# Patient Record
Sex: Female | Born: 1980 | Race: Black or African American | Hispanic: No | Marital: Married | Smoking: Never smoker
Health system: Southern US, Community
[De-identification: ages and names within clinical notes are randomized; demographics above are authoritative.]

## PROBLEM LIST (undated history)

## (undated) DIAGNOSIS — I10 Essential (primary) hypertension: Secondary | ICD-10-CM

## (undated) DIAGNOSIS — D649 Anemia, unspecified: Secondary | ICD-10-CM

## (undated) DIAGNOSIS — K219 Gastro-esophageal reflux disease without esophagitis: Secondary | ICD-10-CM

## (undated) DIAGNOSIS — R519 Headache, unspecified: Secondary | ICD-10-CM

## (undated) HISTORY — DX: Gastro-esophageal reflux disease without esophagitis: K21.9

## (undated) HISTORY — DX: Anemia, unspecified: D64.9

## (undated) HISTORY — DX: Headache, unspecified: R51.9

## (undated) HISTORY — DX: Essential (primary) hypertension: I10

---

## 2014-03-04 ENCOUNTER — Ambulatory Visit
Admission: RE | Admit: 2014-03-04 | Discharge: 2014-03-04 | Disposition: A | Discharge status: Home / Self Care | Payer: No Typology Code available for payment source | Source: Ambulatory Visit | Attending: Infectious Disease | Admitting: Infectious Disease

## 2014-03-04 ENCOUNTER — Other Ambulatory Visit: Payer: Self-pay | Admitting: Infectious Disease

## 2014-03-04 DIAGNOSIS — R7611 Nonspecific reaction to tuberculin skin test without active tuberculosis: Secondary | ICD-10-CM

## 2014-11-14 ENCOUNTER — Ambulatory Visit: Payer: Self-pay

## 2014-12-09 ENCOUNTER — Ambulatory Visit: Payer: Self-pay | Attending: Internal Medicine

## 2017-02-13 DIAGNOSIS — D649 Anemia, unspecified: Secondary | ICD-10-CM | POA: Insufficient documentation

## 2017-03-27 DIAGNOSIS — D509 Iron deficiency anemia, unspecified: Secondary | ICD-10-CM | POA: Insufficient documentation

## 2017-09-16 ENCOUNTER — Ambulatory Visit: Payer: Self-pay | Admitting: Nurse Practitioner

## 2017-09-16 VITALS — BP 110/85 | HR 64 | Temp 99.3°F | Resp 16 | Ht >= 80 in | Wt 305.8 lb

## 2017-09-16 DIAGNOSIS — Z0000 Encounter for general adult medical examination without abnormal findings: Secondary | ICD-10-CM

## 2017-09-16 NOTE — Progress Notes (Signed)
Subjective:  Linda [name] is a 37 y.o. female who presents for basic physical exam. Patient denies any current health related concerns. Patient denies taking any medications and does not have any medication allergies.  Patient's LMP was the first of January.  Denies smoking, drinks socially and does not use street drugs.  Patient needs "clinical statement of health" form completed for this visit.  Past medical history, allergies and medications reviewed with patient.  Social History   Tobacco Use  . Smoking status: Not on file  Substance Use Topics  . Alcohol use: Not on file  . Drug use: Not on file    Not on File  No current outpatient medications on file.   No current facility-administered medications for this visit.     Review of Systems  Constitutional: Negative.   HENT: Negative.   Eyes: Negative.   Respiratory: Negative.   Cardiovascular: Negative.   Gastrointestinal: Negative.   Genitourinary: Negative.   Musculoskeletal: Negative.   Skin: Negative.   Neurological: Negative.   Endo/Heme/Allergies: Negative.   Psychiatric/Behavioral: Negative.     BP 110/85 (BP Location: Right Arm, Patient Position: Sitting, Cuff Size: Normal)   Pulse 64   Temp 99.3 F (37.4 C) (Oral)   Resp 16   Ht 6\' 8"  (2.032 m)   Wt (!) 305 lb 12.8 oz (138.7 kg)   BMI 33.59 kg/m    Objective:  BP 110/85 (BP Location: Right Arm, Patient Position: Sitting, Cuff Size: Normal)   Pulse 64   Temp 99.3 F (37.4 C) (Oral)   Resp 16   Ht 6\' 8"  (2.032 m)   Wt (!) 305 lb 12.8 oz (138.7 kg)   BMI 33.59 kg/m   General Appearance:  Alert, cooperative, no distress, appears stated age  Head:  Normocephalic, without obvious abnormality, atraumatic  Eyes:  PERRL, conjunctiva/corneas clear, EOM's intact, fundi benign, both eyes  Ears:  Normal TM's and external ear canals, both ears  Nose: Nares normal, septum midline,mucosa normal, no drainage or sinus tenderness  Throat: Lips, mucosa,  and tongue normal; teeth and gums normal  Neck: Supple, symmetrical, trachea midline, no adenopathy;  thyroid: not enlarged, symmetric, no tenderness/mass/nodules; no carotid bruit or JVD  Back:   Symmetric, no curvature, ROM normal, no CVA tenderness  Lungs:   Clear to auscultation bilaterally, respirations unlabored  Breasts:  No masses or tenderness  Heart:  Regular rate and rhythm, S1 and S2 normal, no murmur, rub, or gallop  Abdomen:   Soft, non-tender, bowel sounds active all four quadrants,  no masses, no organomegaly  Pelvic: Deferred  Extremities: Extremities normal, atraumatic, no cyanosis or edema  Pulses: 2+ and symmetric  Skin: Skin color, texture, turgor normal, no rashes or lesions  Lymph nodes: Cervical and submandibular nodes normal  Neurologic: Normal        Assessment:  basic physical exam    Plan:  Patient education provided.  No labs needed at this time. Patient education provided regarding health maintenance and prevention.  Patient will follow up as needed. Patient will follow up with PCP.

## 2017-09-16 NOTE — Patient Instructions (Addendum)
Health Maintenance, Female Adopting a healthy lifestyle and getting preventive care can go a long way to promote health and wellness. Talk with your health care provider about what schedule of regular examinations is right for you. This is a good chance for you to check in with your provider about disease prevention and staying healthy. In between checkups, there are plenty of things you can do on your own. Experts have done a lot of research about which lifestyle changes and preventive measures are most likely to keep you healthy. Ask your health care provider for more information. Weight and diet Eat a healthy diet  Be sure to include plenty of vegetables, fruits, low-fat dairy products, and lean protein.  Do not eat a lot of foods high in solid fats, added sugars, or salt.  Get regular exercise. This is one of the most important things you can do for your health. ? Most adults should exercise for at least 150 minutes each week. The exercise should increase your heart rate and make you sweat (moderate-intensity exercise). ? Most adults should also do strengthening exercises at least twice a week. This is in addition to the moderate-intensity exercise.  Maintain a healthy weight  Body mass index (BMI) is a measurement that can be used to identify possible weight problems. It estimates body fat based on height and weight. Your health care provider can help determine your BMI and help you achieve or maintain a healthy weight.  For females 20 years of age and older: ? A BMI below 18.5 is considered underweight. ? A BMI of 18.5 to 24.9 is normal. ? A BMI of 25 to 29.9 is considered overweight. ? A BMI of 30 and above is considered obese.  Watch levels of cholesterol and blood lipids  You should start having your blood tested for lipids and cholesterol at 36 years of age, then have this test every 5 years.  You may need to have your cholesterol levels checked more often if: ? Your lipid or  cholesterol levels are high. ? You are older than 36 years of age. ? You are at high risk for heart disease.  Cancer screening Lung Cancer  Lung cancer screening is recommended for adults 55-80 years old who are at high risk for lung cancer because of a history of smoking.  A yearly low-dose CT scan of the lungs is recommended for people who: ? Currently smoke. ? Have quit within the past 15 years. ? Have at least a 30-pack-year history of smoking. A pack year is smoking an average of one pack of cigarettes a day for 1 year.  Yearly screening should continue until it has been 15 years since you quit.  Yearly screening should stop if you develop a health problem that would prevent you from having lung cancer treatment.  Breast Cancer  Practice breast self-awareness. This means understanding how your breasts normally appear and feel.  It also means doing regular breast self-exams. Let your health care provider know about any changes, no matter how small.  If you are in your 20s or 30s, you should have a clinical breast exam (CBE) by a health care provider every 1-3 years as part of a regular health exam.  If you are 40 or older, have a CBE every year. Also consider having a breast X-ray (mammogram) every year.  If you have a family history of breast cancer, talk to your health care provider about genetic screening.  If you are at high risk   for breast cancer, talk to your health care provider about having an MRI and a mammogram every year.  Breast cancer gene (BRCA) assessment is recommended for women who have family members with BRCA-related cancers. BRCA-related cancers include: ? Breast. ? Ovarian. ? Tubal. ? Peritoneal cancers.  Results of the assessment will determine the need for genetic counseling and BRCA1 and BRCA2 testing.  Cervical Cancer Your health care provider may recommend that you be screened regularly for cancer of the pelvic organs (ovaries, uterus, and  vagina). This screening involves a pelvic examination, including checking for microscopic changes to the surface of your cervix (Pap test). You may be encouraged to have this screening done every 3 years, beginning at age 22.  For women ages 56-65, health care providers may recommend pelvic exams and Pap testing every 3 years, or they may recommend the Pap and pelvic exam, combined with testing for human papilloma virus (HPV), every 5 years. Some types of HPV increase your risk of cervical cancer. Testing for HPV may also be done on women of any age with unclear Pap test results.  Other health care providers may not recommend any screening for nonpregnant women who are considered low risk for pelvic cancer and who do not have symptoms. Ask your health care provider if a screening pelvic exam is right for you.  If you have had past treatment for cervical cancer or a condition that could lead to cancer, you need Pap tests and screening for cancer for at least 20 years after your treatment. If Pap tests have been discontinued, your risk factors (such as having a new sexual partner) need to be reassessed to determine if screening should resume. Some women have medical problems that increase the chance of getting cervical cancer. In these cases, your health care provider may recommend more frequent screening and Pap tests.  Colorectal Cancer  This type of cancer can be detected and often prevented.  Routine colorectal cancer screening usually begins at 36 years of age and continues through 36 years of age.  Your health care provider may recommend screening at an earlier age if you have risk factors for colon cancer.  Your health care provider may also recommend using home test kits to check for hidden blood in the stool.  A small camera at the end of a tube can be used to examine your colon directly (sigmoidoscopy or colonoscopy). This is done to check for the earliest forms of colorectal  cancer.  Routine screening usually begins at age 33.  Direct examination of the colon should be repeated every 5-10 years through 36 years of age. However, you may need to be screened more often if early forms of precancerous polyps or small growths are found.  Skin Cancer  Check your skin from head to toe regularly.  Tell your health care provider about any new moles or changes in moles, especially if there is a change in a mole's shape or color.  Also tell your health care provider if you have a mole that is larger than the size of a pencil eraser.  Always use sunscreen. Apply sunscreen liberally and repeatedly throughout the day.  Protect yourself by wearing long sleeves, pants, a wide-brimmed hat, and sunglasses whenever you are outside.  Heart disease, diabetes, and high blood pressure  High blood pressure causes heart disease and increases the risk of stroke. High blood pressure is more likely to develop in: ? People who have blood pressure in the high end of  the normal range (130-139/85-89 mm Hg). ? People who are overweight or obese. ? People who are African American.  If you are 21-29 years of age, have your blood pressure checked every 3-5 years. If you are 3 years of age or older, have your blood pressure checked every year. You should have your blood pressure measured twice-once when you are at a hospital or clinic, and once when you are not at a hospital or clinic. Record the average of the two measurements. To check your blood pressure when you are not at a hospital or clinic, you can use: ? An automated blood pressure machine at a pharmacy. ? A home blood pressure monitor.  If you are between 17 years and 37 years old, ask your health care provider if you should take aspirin to prevent strokes.  Have regular diabetes screenings. This involves taking a blood sample to check your fasting blood sugar level. ? If you are at a normal weight and have a low risk for diabetes,  have this test once every three years after 36 years of age. ? If you are overweight and have a high risk for diabetes, consider being tested at a younger age or more often. Preventing infection Hepatitis B  If you have a higher risk for hepatitis B, you should be screened for this virus. You are considered at high risk for hepatitis B if: ? You were born in a country where hepatitis B is common. Ask your health care provider which countries are considered high risk. ? Your parents were born in a high-risk country, and you have not been immunized against hepatitis B (hepatitis B vaccine). ? You have HIV or AIDS. ? You use needles to inject street drugs. ? You live with someone who has hepatitis B. ? You have had sex with someone who has hepatitis B. ? You get hemodialysis treatment. ? You take certain medicines for conditions, including cancer, organ transplantation, and autoimmune conditions.  Hepatitis C  Blood testing is recommended for: ? Everyone born from [birth year] through 1964. ? Anyone with known risk factors for hepatitis C.  Sexually transmitted infections (STIs)  You should be screened for sexually transmitted infections (STIs) including gonorrhea and chlamydia if: ? You are sexually active and are younger than 36 years of age. ? You are older than 36 years of age and your health care provider tells you that you are at risk for this type of infection. ? Your sexual activity has changed since you were last screened and you are at an increased risk for chlamydia or gonorrhea. Ask your health care provider if you are at risk.  If you do not have HIV, but are at risk, it may be recommended that you take a prescription medicine daily to prevent HIV infection. This is called pre-exposure prophylaxis (PrEP). You are considered at risk if: ? You are sexually active and do not regularly use condoms or know the HIV status of your partner(s). ? You take drugs by injection. ? You are  sexually active with a partner who has HIV.  Talk with your health care provider about whether you are at high risk of being infected with HIV. If you choose to begin PrEP, you should first be tested for HIV. You should then be tested every 3 months for as long as you are taking PrEP. Pregnancy  If you are premenopausal and you may become pregnant, ask your health care provider about preconception counseling.  If you may become  pregnant, take 400 to 800 micrograms (mcg) of folic acid every day.  If you want to prevent pregnancy, talk to your health care provider about birth control (contraception). Osteoporosis and menopause  Osteoporosis is a disease in which the bones lose minerals and strength with aging. This can result in serious bone fractures. Your risk for osteoporosis can be identified using a bone density scan.  If you are 65 years of age or older, or if you are at risk for osteoporosis and fractures, ask your health care provider if you should be screened.  Ask your health care provider whether you should take a calcium or vitamin D supplement to lower your risk for osteoporosis.  Menopause may have certain physical symptoms and risks.  Hormone replacement therapy may reduce some of these symptoms and risks. Talk to your health care provider about whether hormone replacement therapy is right for you. Follow these instructions at home:  Schedule regular health, dental, and eye exams.  Stay current with your immunizations.  Do not use any tobacco products including cigarettes, chewing tobacco, or electronic cigarettes.  If you are pregnant, do not drink alcohol.  If you are breastfeeding, limit how much and how often you drink alcohol.  Limit alcohol intake to no more than 1 drink per day for nonpregnant women. One drink equals 12 ounces of beer, 5 ounces of wine, or 1 ounces of hard liquor.  Do not use street drugs.  Do not share needles.  Ask your health care  provider for help if you need support or information about quitting drugs.  Tell your health care provider if you often feel depressed.  Tell your health care provider if you have ever been abused or do not feel safe at home. This information is not intended to replace advice given to you by your health care provider. Make sure you discuss any questions you have with your health care provider. Document Released: 02/25/2011 Document Revised: 01/18/2016 Document Reviewed: 05/16/2015 Elsevier Interactive Patient Education  2018 [employer]  Preventive Care 18-39 Years, Female Preventive care refers to lifestyle choices and visits with your health care provider that can promote health and wellness. What does preventive care include?  A yearly physical exam. This is also called an annual well check.  Dental exams once or twice a year.  Routine eye exams. Ask your health care provider how often you should have your eyes checked.  Personal lifestyle choices, including: ? Daily care of your teeth and gums. ? Regular physical activity. ? Eating a healthy diet. ? Avoiding tobacco and drug use. ? Limiting alcohol use. ? Practicing safe sex. ? Taking vitamin and mineral supplements as recommended by your health care provider. What happens during an annual well check? The services and screenings done by your health care provider during your annual well check will depend on your age, overall health, lifestyle risk factors, and family history of disease. Counseling Your health care provider may ask you questions about your:  Alcohol use.  Tobacco use.  Drug use.  Emotional well-being.  Home and relationship well-being.  Sexual activity.  Eating habits.  Work and work environment.  Method of birth control.  Menstrual cycle.  Pregnancy history.  Screening You may have the following tests or measurements:  Height, weight, and BMI.  Diabetes screening. This is done by  checking your blood sugar (glucose) after you have not eaten for a while (fasting).  Blood pressure.  Lipid and cholesterol levels. These may be checked   every 5 years starting at age 76.  Skin check.  Hepatitis C blood test.  Hepatitis B blood test.  Sexually transmitted disease (STD) testing.  BRCA-related cancer screening. This may be done if you have a family history of breast, ovarian, tubal, or peritoneal cancers.  Pelvic exam and Pap test. This may be done every 3 years starting at age 64. Starting at age 64, this may be done every 5 years if you have a Pap test in combination with an HPV test.  Discuss your test results, treatment options, and if necessary, the need for more tests with your health care provider. Vaccines Your health care provider may recommend certain vaccines, such as:  Influenza vaccine. This is recommended every year.  Tetanus, diphtheria, and acellular pertussis (Tdap, Td) vaccine. You may need a Td booster every 10 years.  Varicella vaccine. You may need this if you have not been vaccinated.  HPV vaccine. If you are 48 or younger, you may need three doses over 6 months.  Measles, mumps, and rubella (MMR) vaccine. You may need at least one dose of MMR. You may also need a second dose.  Pneumococcal 13-valent conjugate (PCV13) vaccine. You may need this if you have certain conditions and were not previously vaccinated.  Pneumococcal polysaccharide (PPSV23) vaccine. You may need one or two doses if you smoke cigarettes or if you have certain conditions.  Meningococcal vaccine. One dose is recommended if you are age 16-21 years and a first-year college student living in a residence hall, or if you have one of several medical conditions. You may also need additional booster doses.  Hepatitis A vaccine. You may need this if you have certain conditions or if you travel or work in places where you may be exposed to hepatitis A.  Hepatitis B vaccine. You  may need this if you have certain conditions or if you travel or work in places where you may be exposed to hepatitis B.  Haemophilus influenzae type b (Hib) vaccine. You may need this if you have certain risk factors.  Talk to your health care provider about which screenings and vaccines you need and how often you need them. This information is not intended to replace advice given to you by your health care provider. Make sure you discuss any questions you have with your health care provider. Document Released: 10/08/2001 Document Revised: 05/01/2016 Document Reviewed: 06/13/2015 Elsevier Interactive Patient Education  [employer].

## 2019-02-08 ENCOUNTER — Other Ambulatory Visit: Payer: Self-pay

## 2019-02-08 ENCOUNTER — Encounter: Payer: Self-pay | Admitting: Family Medicine

## 2019-02-08 ENCOUNTER — Ambulatory Visit (INDEPENDENT_AMBULATORY_CARE_PROVIDER_SITE_OTHER): Payer: 59 | Admitting: Family Medicine

## 2019-02-08 VITALS — BP 175/93 | HR 76 | Temp 98.4°F | Wt 309.2 lb

## 2019-02-08 DIAGNOSIS — R519 Headache, unspecified: Secondary | ICD-10-CM

## 2019-02-08 DIAGNOSIS — K219 Gastro-esophageal reflux disease without esophagitis: Secondary | ICD-10-CM | POA: Diagnosis not present

## 2019-02-08 DIAGNOSIS — R51 Headache: Secondary | ICD-10-CM

## 2019-02-08 DIAGNOSIS — I10 Essential (primary) hypertension: Secondary | ICD-10-CM | POA: Diagnosis not present

## 2019-02-08 DIAGNOSIS — M25562 Pain in left knee: Secondary | ICD-10-CM

## 2019-02-08 MED ORDER — DICLOFENAC SODIUM 1.5 % TD SOLN: TRANSDERMAL | 3 refills | Status: DC

## 2019-02-08 MED ORDER — AMLODIPINE BESYLATE 10 MG PO TABS: 10.0000 mg | ORAL_TABLET | Freq: Every day | ORAL | 3 refills | Status: DC

## 2019-02-08 NOTE — Assessment & Plan Note (Signed)
Likely has underlying osteoarthritis based on her exam.  We will start topical Pennsaid.  She can continue using Tylenol as needed.  Recommended compression and ice.  Discussed home exercise program and handout was given.  If no improvement, would consider referral to PT and/or sports medicine.

## 2019-02-08 NOTE — Assessment & Plan Note (Signed)
Recommended over-the-counter famotidine as needed.

## 2019-02-08 NOTE — Patient Instructions (Signed)
It was very nice to see you today!  Your blood pressure is very elevated today.  Please start the amlodipine.  Please check your blood pressure several days per week over the next couple of weeks and see me my chart message with your readings over couple of weeks.  Let me know if you have any side effects.  I think you have arthritis in both of your knees.  Please use the Pennsaid as needed.  It is okay for you to use Tylenol 1000 mg 3 times daily as needed.  Compression sleeves or Ace bandages to your knees will also help.  It is also a good idea to use ice very frequently.  Losing weight will help with your knee pain.  Please work on the exercises.  You can try using famotidine for your reflux.  Please come back to see me in a few weeks for your physical with blood work and your Pap smear.  Please come back to see me sooner if anything else comes up.  Take care, Dr Jerline Pain

## 2019-02-08 NOTE — Assessment & Plan Note (Signed)
Stable.  Continue over-the-counter analgesics as needed.  Hopefully will have some improvement as we treat her blood pressure.

## 2019-02-08 NOTE — Progress Notes (Signed)
Chief Complaint:  Linda [name] is a 38 y.o. female who presents today with a chief complaint of leg pain and to establish care.   Assessment/Plan:  Essential hypertension Well above goal.  Discussed treatment options.  We will start amlodipine 10 mg daily.  Discussed potential side effects.  She will check blood pressure at home and at work over the next couple of weeks and then report back via my chart.  Left knee pain Likely has underlying osteoarthritis based on her exam.  We will start topical Pennsaid.  She can continue using Tylenol as needed.  Recommended compression and ice.  Discussed home exercise program and handout was given.  If no improvement, would consider referral to PT and/or sports medicine.  Gastroesophageal reflux disease Recommended over-the-counter famotidine as needed.  Frequent headaches Stable.  Continue over-the-counter analgesics as needed.  Hopefully will have some improvement as we treat her blood pressure.  Preventative Healthcare Patient was instructed to return soon for CPE.  She is due for her Pap smear. Health Maintenance Due  Topic Date Due  . HIV Screening  08/18/1997     Subjective:  HPI:  Leg Pain  Patient with several year history of bilateral lower extremity pain however over the last couple months has noticed worsening pain in her left knee.  She also has some pain in her right knee as well.  Symptoms seem to be worse when walking or walking up a flight of stairs.  She noticed some cracking and popping with walking climbing stairs as well.   No swelling.  No redness.  No fevers or chills.  She has tried taking Tylenol and Aleve at night which helped.  No history of trauma or other obvious precipitating events.  No other obvious alleviating or aggravating factors.  Elevated blood pressure reading Patient has history of gestational hypertension.  She is also been told a few times in the past that she has had high blood pressures.   She is not currently on any medications and is never been on any blood pressure medications in the past.  No reported chest pain or shortness of breath.  Has a strong family history of hypertension.  She has other chronic conditions outlined below:  # GERD - Uses TUMS as needed which works well  # Headaches  -Infrequently uses Tylenol or Aleve as needed.  ROS: Per HPI, otherwise a complete review of systems was negative.   PMH:  The following were reviewed and entered/updated in epic: Past Medical History:  Diagnosis Date  . Frequent headaches   . GERD (gastroesophageal reflux disease)    Patient Active Problem List   Diagnosis Date Noted  . Left knee pain 02/08/2019  . Essential hypertension 02/08/2019  . Gastroesophageal reflux disease 02/08/2019  . Frequent headaches 02/08/2019   Past Surgical History:  Procedure Laterality Date  . CESAREAN SECTION      Family History  Problem Relation Age of Onset  . Hypertension Mother   . Hypertension Father   . Cancer Neg Hx     Medications- reviewed and updated Current Outpatient Medications  Medication Sig Dispense Refill  . amLODipine (NORVASC) 10 MG tablet Take 1 tablet (10 mg total) by mouth daily. 90 tablet 3  . Diclofenac Sodium 1.5 % SOLN Use 3-4 times per day as needed. 150 mL 3   No current facility-administered medications for this visit.     Allergies-reviewed and updated No Known Allergies  Social History   Socioeconomic History  .  Marital status: Married    Spouse name: Not on file  . Number of children: Not on file  . Years of education: Not on file  . Highest education level: Not on file  Occupational History  . Not on file  Social Needs  . Financial resource strain: Not on file  . Food insecurity    Worry: Not on file    Inability: Not on file  . Transportation needs    Medical: Not on file    Non-medical: Not on file  Tobacco Use  . Smoking status: Never Smoker  . Smokeless tobacco: Never  Used  Substance and Sexual Activity  . Alcohol use: Yes    Comment: occu  . Drug use: Never  . Sexual activity: Yes  Lifestyle  . Physical activity    Days per week: Not on file    Minutes per session: Not on file  . Stress: Not on file  Relationships  . Social Herbalist on phone: Not on file    Gets together: Not on file    Attends religious service: Not on file    Active member of club or organization: Not on file    Attends meetings of clubs or organizations: Not on file    Relationship status: Not on file  Other Topics Concern  . Not on file  Social History Narrative  . Not on file           Objective:  Physical Exam: BP (!) 175/93 (BP Location: Left Arm, Patient Position: Sitting, Cuff Size: Large)   Pulse 76   Temp 98.4 F (36.9 C) (Oral)   Wt (!) 309 lb 3.2 oz (140.3 kg)   SpO2 98%   BMI 33.97 kg/m   Gen: NAD, resting comfortably CV: Regular rate and rhythm with no murmurs appreciated Pulm: Normal work of breathing, clear to auscultation bilaterally with no crackles, wheezes, or rhonchi GI: Normal bowel sounds present. Soft, Nontender, Nondistended. MSK: No edema, cyanosis, or clubbing noted -Lower extremities: No deformities.  Crepitus with active range of motion at both knees left greater than right.  Left knee tender to palpation along joint line.  Neurovascularly intact distally.  Posterior and anterior drawer signs negative bilaterally.  McMurray negative. Skin: Warm, dry Neuro: Grossly normal, moves all extremities Psych: Normal affect and thought content      Caleb M. Jerline Pain, MD 02/08/2019 11:00 AM

## 2019-02-08 NOTE — Assessment & Plan Note (Signed)
Well above goal.  Discussed treatment options.  We will start amlodipine 10 mg daily.  Discussed potential side effects.  She will check blood pressure at home and at work over the next couple of weeks and then report back via my chart.

## 2019-03-04 ENCOUNTER — Encounter: Payer: Self-pay | Admitting: Family Medicine

## 2019-03-15 ENCOUNTER — Encounter: Payer: 59 | Admitting: Family Medicine

## 2019-03-15 ENCOUNTER — Ambulatory Visit (INDEPENDENT_AMBULATORY_CARE_PROVIDER_SITE_OTHER): Payer: 59 | Admitting: Physician Assistant

## 2019-03-15 ENCOUNTER — Other Ambulatory Visit: Payer: Self-pay

## 2019-03-15 ENCOUNTER — Other Ambulatory Visit: Payer: Self-pay | Admitting: *Deleted

## 2019-03-15 ENCOUNTER — Encounter: Payer: Self-pay | Admitting: Physician Assistant

## 2019-03-15 VITALS — BP 130/94 | HR 70 | Temp 99.3°F | Ht 67.0 in | Wt 306.4 lb

## 2019-03-15 DIAGNOSIS — Z0001 Encounter for general adult medical examination with abnormal findings: Secondary | ICD-10-CM

## 2019-03-15 DIAGNOSIS — R2241 Localized swelling, mass and lump, right lower limb: Secondary | ICD-10-CM | POA: Diagnosis not present

## 2019-03-15 DIAGNOSIS — E669 Obesity, unspecified: Secondary | ICD-10-CM

## 2019-03-15 DIAGNOSIS — I10 Essential (primary) hypertension: Secondary | ICD-10-CM

## 2019-03-15 DIAGNOSIS — R7309 Other abnormal glucose: Secondary | ICD-10-CM | POA: Diagnosis not present

## 2019-03-15 DIAGNOSIS — Z136 Encounter for screening for cardiovascular disorders: Secondary | ICD-10-CM

## 2019-03-15 DIAGNOSIS — Z114 Encounter for screening for human immunodeficiency virus [HIV]: Secondary | ICD-10-CM

## 2019-03-15 DIAGNOSIS — Z13220 Encounter for screening for lipoid disorders: Secondary | ICD-10-CM | POA: Diagnosis not present

## 2019-03-15 LAB — LIPID PANEL
Cholesterol: 174 mg/dL (ref 0–200)
HDL: 56.9 mg/dL (ref >39.00)
LDL Cholesterol: 108 mg/dL — ABNORMAL HIGH (ref 0–99)
NonHDL: 117.29
Total CHOL/HDL Ratio: 3
Triglycerides: 48 mg/dL (ref 0.0–149.0)
VLDL: 9.6 mg/dL (ref 0.0–40.0)

## 2019-03-15 LAB — CBC WITH DIFFERENTIAL/PLATELET
Basophils Absolute: 0 10*3/uL (ref 0.0–0.1)
Basophils Relative: 0.3 % (ref 0.0–3.0)
Eosinophils Absolute: 0 10*3/uL (ref 0.0–0.7)
Eosinophils Relative: 0.4 % (ref 0.0–5.0)
HCT: 35.9 % — ABNORMAL LOW (ref 36.0–46.0)
Hemoglobin: 11.2 g/dL — ABNORMAL LOW (ref 12.0–15.0)
Lymphocytes Relative: 44.5 % (ref 12.0–46.0)
Lymphs Abs: 2.8 10*3/uL (ref 0.7–4.0)
MCHC: 31.3 g/dL (ref 30.0–36.0)
MCV: 71.5 fl — ABNORMAL LOW (ref 78.0–100.0)
Monocytes Absolute: 0.5 10*3/uL (ref 0.1–1.0)
Monocytes Relative: 8 % (ref 3.0–12.0)
Neutro Abs: 2.9 10*3/uL (ref 1.4–7.7)
Neutrophils Relative %: 46.8 % (ref 43.0–77.0)
Platelets: 328 10*3/uL (ref 150.0–400.0)
RBC: 5.02 Mil/uL (ref 3.87–5.11)
RDW: 17.8 % — ABNORMAL HIGH (ref 11.5–15.5)
WBC: 6.3 10*3/uL (ref 4.0–10.5)

## 2019-03-15 LAB — COMPREHENSIVE METABOLIC PANEL
ALT: 14 U/L (ref 0–35)
AST: 12 U/L (ref 0–37)
Albumin: 3.9 g/dL (ref 3.5–5.2)
Alkaline Phosphatase: 77 U/L (ref 39–117)
BUN: 7 mg/dL (ref 6–23)
CO2: 27 mEq/L (ref 19–32)
Calcium: 8.9 mg/dL (ref 8.4–10.5)
Chloride: 102 mEq/L (ref 96–112)
Creatinine, Ser: 0.72 mg/dL (ref 0.40–1.20)
GFR: 110.55 mL/min (ref >60.00)
Glucose, Bld: 114 mg/dL — ABNORMAL HIGH (ref 70–99)
Potassium: 3.6 mEq/L (ref 3.5–5.1)
Sodium: 138 mEq/L (ref 135–145)
Total Bilirubin: 0.3 mg/dL (ref 0.2–1.2)
Total Protein: 7.3 g/dL (ref 6.0–8.3)

## 2019-03-15 MED ORDER — AMLODIPINE BESYLATE 10 MG PO TABS: 10.0000 mg | ORAL_TABLET | Freq: Every day | ORAL | 1 refills | Status: DC

## 2019-03-15 NOTE — Progress Notes (Signed)
I acted as a Education administrator for [employer], PA-C Anselmo Pickler, LPN  Subjective:    Mekaila Tarnow is a 38 y.o. female and is here for a comprehensive physical exam.  HPI  Health Maintenance Due  Topic Date Due  . HIV Screening  08/18/1997    Acute Concerns: Skin nodule on L second toe -- has been there for several years. Denies: trauma, pain, discharge, numbness/tingling. Area is dark. She is concerned because it seems like it is getting bigger.  Chronic Issues: HTN -- Currently taking Norvasc 5 mg. At home blood pressure readings are: <140/80 on average. Patient denies chest pain, SOB, blurred vision, dizziness, unusual headaches, lower leg swelling. Patient is compliant with medication. Denies excessive caffeine intake, stimulant usage, excessive alcohol intake, or increase in salt consumption. Obesity -- currently not following any diets or exercise regimen and is not motivated to do so at this time  Health Maintenance: Immunizations -- UTD Colonoscopy -- N/A Mammogram -- N/A PAP -- overdue, needs referral for OB/GYN Bone Density -- N/A Diet -- eats most food groups, usually has two meals a day when working Sleep habits -- taking melatonin has helped her significantly Exercise -- none Current Weight -- Weight: (!) 306 lb 6.1 oz (139 kg)  Weight History: Wt Readings from Last 10 Encounters:  03/15/19 (!) 306 lb 6.1 oz (139 kg)  02/08/19 (!) 309 lb 3.2 oz (140.3 kg)  09/16/17 (!) 305 lb 12.8 oz (138.7 kg)   Mood -- has occasional anxiety but nothing debilitating, denies depression Patient's last menstrual period was 02/26/2019.   Depression screen PHQ 2/9 03/15/2019  Decreased Interest 0  Down, Depressed, Hopeless 0  PHQ - 2 Score 0   Other providers/specialists: Patient Care Team: Vivi Barrack, MD as PCP - General (Family Medicine)   PMHx, SurgHx, SocialHx, Medications, and Allergies were reviewed in the Visit Navigator and updated as appropriate.    Past Medical History:  Diagnosis Date  . Frequent headaches   . GERD (gastroesophageal reflux disease)      Past Surgical History:  Procedure Laterality Date  . CESAREAN SECTION       Family History  Problem Relation Age of Onset  . Hypertension Mother   . Hypertension Father   . Cancer Neg Hx     Social History   Tobacco Use  . Smoking status: Never Smoker  . Smokeless tobacco: Never Used  Substance Use Topics  . Alcohol use: Yes    Comment: occu  . Drug use: Never    Review of Systems:   Review of Systems  Constitutional: Negative for chills, fever, malaise/fatigue and weight loss.  HENT: Negative for hearing loss, sinus pain and sore throat.   Respiratory: Negative for cough and hemoptysis.   Cardiovascular: Negative for chest pain, palpitations, leg swelling and PND.  Gastrointestinal: Negative for abdominal pain, constipation, diarrhea, heartburn, nausea and vomiting.  Genitourinary: Negative for dysuria, frequency and urgency.  Musculoskeletal: Negative for back pain, myalgias and neck pain.  Skin: Negative for itching and rash.  Neurological: Negative for dizziness, tingling, seizures and headaches.  Endo/Heme/Allergies: Negative for polydipsia.  Psychiatric/Behavioral: Negative for depression. The patient is not nervous/anxious.     Objective:   BP (!) 130/94 (BP Location: Left Arm, Patient Position: Sitting, Cuff Size: Large)   Pulse 70   Temp 99.3 F (37.4 C) (Oral)   Ht 5\' 7"  (1.702 m)   Wt (!) 306 lb 6.1 oz (139 kg)   LMP 02/26/2019  SpO2 97%   BMI 47.99 kg/m   General Appearance:    Alert, cooperative, no distress, appears stated age  Head:    Normocephalic, without obvious abnormality, atraumatic  Eyes:    PERRL, conjunctiva/corneas clear, EOM's intact, fundi    benign, both eyes  Ears:    Normal TM's and external ear canals, both ears  Nose:   Nares normal, septum midline, mucosa normal, no drainage    or sinus tenderness  Throat:    Lips, mucosa, and tongue normal; teeth and gums normal  Neck:   Supple, symmetrical, trachea midline, no adenopathy;    thyroid:  no enlargement/tenderness/nodules; no carotid   bruit or JVD  Back:     Symmetric, no curvature, ROM normal, no CVA tenderness  Lungs:     Clear to auscultation bilaterally, respirations unlabored  Chest Wall:    No tenderness or deformity   Heart:    Regular rate and rhythm, S1 and S2 normal, no murmur, rub   or gallop  Breast Exam:    Deferred  Abdomen:     Soft, non-tender, bowel sounds active all four quadrants,    no masses, no organomegaly  Genitalia:    Deferred  Rectal:    Deferred  Extremities:   Extremities normal, atraumatic, no cyanosis or edema Second toe on L foot with small area of fluid-filled cyst? Appears very dark. No TTP.  Pulses:   2+ and symmetric all extremities  Skin:   Skin color, texture, turgor normal, no rashes or lesions  Lymph nodes:   Cervical, supraclavicular, and axillary nodes normal  Neurologic:   CNII-XII intact, normal strength, sensation and reflexes    Throughout; normal sensation to toes   No results found for this or any previous visit.  Assessment/Plan:   Chanita was seen today for annual exam.  Diagnoses and all orders for this visit:  Encounter for general adult medical examination with abnormal findings Today patient counseled on age appropriate routine health concerns for screening and prevention, each reviewed and up to date or declined. Immunizations reviewed and up to date or declined. Labs ordered and reviewed. Risk factors for depression reviewed and negative. Hearing function and visual acuity are intact. ADLs screened and addressed as needed. Functional ability and level of safety reviewed and appropriate. Education, counseling and referrals performed based on assessed risks today. Patient provided with a copy of personalized plan for preventive services.  -     Ambulatory referral to Obstetrics /  Gynecology  Screening for HIV (human immunodeficiency virus) -     HIV Antibody (routine testing w rflx)  Encounter for lipid screening for cardiovascular disease -     Lipid panel  Essential hypertension Overall controlled, based on her home readings. Encouraged reduction in salt. -     CBC with Differential/Platelet -     Comprehensive metabolic panel  Obesity She is not ready for change at this time. Discussed that we have tools to help her with weight loss if she is ever interested. Encouraged exercise.  Skin nodule of toe of right foot Referral to podiatry for further evaluation and treatment. -     Ambulatory referral to Podiatry    Well Adult Exam: Labs ordered: Yes. Patient counseling was done. See below for items discussed. Discussed the patient's BMI. The BMI is not in the acceptable range; BMI management plan is completed Follow up as needed for acute illness.  Patient Counseling:   [x]     Nutrition: Stressed importance  of moderation in sodium/caffeine intake, saturated fat and cholesterol, caloric balance, sufficient intake of fresh fruits, vegetables, fiber, calcium, iron, and 1 mg of folate supplement per day (for females capable of pregnancy).   [x]      Stressed the importance of regular exercise.    [x]     Substance Abuse: Discussed cessation/primary prevention of tobacco, alcohol, or other drug use; driving or other dangerous activities under the influence; availability of treatment for abuse.    [x]      Injury prevention: Discussed safety belts, safety helmets, smoke detector, smoking near bedding or upholstery.    [x]      Sexuality: Discussed sexually transmitted diseases, partner selection, use of condoms, avoidance of unintended pregnancy  and contraceptive alternatives.    [x]     Dental health: Discussed importance of regular tooth brushing, flossing, and dental visits.   [x]      Health maintenance and immunizations reviewed. Please refer to Health  maintenance section.   CMA or LPN served as scribe during this visit. History, Physical, and Plan performed by medical provider. The above documentation has been reviewed and is accurate and complete.   Inda Coke, PA-C [place]

## 2019-03-15 NOTE — Patient Instructions (Signed)
It was great to see you!  Please go to the lab for blood work.   Our office will call you with your results unless you have chosen to receive results via MyChart.  If your blood work is normal we will follow-up each year for physicals and as scheduled for chronic medical problems.  If anything is abnormal we will treat accordingly and get you in for a follow-up.  Take care,  Draeden Kellman    Health Maintenance, Female Adopting a healthy lifestyle and getting preventive care are important in promoting health and wellness. Ask your health care provider about:  The right schedule for you to have regular tests and exams.  Things you can do on your own to prevent diseases and keep yourself healthy. What should I know about diet, weight, and exercise? Eat a healthy diet   Eat a diet that includes plenty of vegetables, fruits, low-fat dairy products, and lean protein.  Do not eat a lot of foods that are high in solid fats, added sugars, or sodium. Maintain a healthy weight Body mass index (BMI) is used to identify weight problems. It estimates body fat based on height and weight. Your health care provider can help determine your BMI and help you achieve or maintain a healthy weight. Get regular exercise Get regular exercise. This is one of the most important things you can do for your health. Most adults should:  Exercise for at least 150 minutes each week. The exercise should increase your heart rate and make you sweat (moderate-intensity exercise).  Do strengthening exercises at least twice a week. This is in addition to the moderate-intensity exercise.  Spend less time sitting. Even light physical activity can be beneficial. Watch cholesterol and blood lipids Have your blood tested for lipids and cholesterol at 37 years of age, then have this test every 5 years. Have your cholesterol levels checked more often if:  Your lipid or cholesterol levels are high.  You are older than 37  years of age.  You are at high risk for heart disease. What should I know about cancer screening? Depending on your health history and family history, you may need to have cancer screening at various ages. This may include screening for:  Breast cancer.  Cervical cancer.  Colorectal cancer.  Skin cancer.  Lung cancer. What should I know about heart disease, diabetes, and high blood pressure? Blood pressure and heart disease  High blood pressure causes heart disease and increases the risk of stroke. This is more likely to develop in people who have high blood pressure readings, are of African descent, or are overweight.  Have your blood pressure checked: ? Every 3-5 years if you are 18-39 years of age. ? Every year if you are 40 years old or older. Diabetes Have regular diabetes screenings. This checks your fasting blood sugar level. Have the screening done:  Once every three years after age 40 if you are at a normal weight and have a low risk for diabetes.  More often and at a younger age if you are overweight or have a high risk for diabetes. What should I know about preventing infection? Hepatitis B If you have a higher risk for hepatitis B, you should be screened for this virus. Talk with your health care provider to find out if you are at risk for hepatitis B infection. Hepatitis C Testing is recommended for:  Everyone born from 1945 through 1965.  Anyone with known risk factors for hepatitis C. Sexually   transmitted infections (STIs)  Get screened for STIs, including gonorrhea and chlamydia, if: ? You are sexually active and are younger than 37 years of age. ? You are older than 37 years of age and your health care provider tells you that you are at risk for this type of infection. ? Your sexual activity has changed since you were last screened, and you are at increased risk for chlamydia or gonorrhea. Ask your health care provider if you are at risk.  Ask your health  care provider about whether you are at high risk for HIV. Your health care provider may recommend a prescription medicine to help prevent HIV infection. If you choose to take medicine to prevent HIV, you should first get tested for HIV. You should then be tested every 3 months for as long as you are taking the medicine. Pregnancy  If you are about to stop having your period (premenopausal) and you may become pregnant, seek counseling before you get pregnant.  Take 400 to 800 micrograms (mcg) of folic acid every day if you become pregnant.  Ask for birth control (contraception) if you want to prevent pregnancy. Osteoporosis and menopause Osteoporosis is a disease in which the bones lose minerals and strength with aging. This can result in bone fractures. If you are 65 years old or older, or if you are at risk for osteoporosis and fractures, ask your health care provider if you should:  Be screened for bone loss.  Take a calcium or vitamin D supplement to lower your risk of fractures.  Be given hormone replacement therapy (HRT) to treat symptoms of menopause. Follow these instructions at home: Lifestyle  Do not use any products that contain nicotine or tobacco, such as cigarettes, e-cigarettes, and chewing tobacco. If you need help quitting, ask your health care provider.  Do not use street drugs.  Do not share needles.  Ask your health care provider for help if you need support or information about quitting drugs. Alcohol use  Do not drink alcohol if: ? Your health care provider tells you not to drink. ? You are pregnant, may be pregnant, or are planning to become pregnant.  If you drink alcohol: ? Limit how much you use to 0-1 drink a day. ? Limit intake if you are breastfeeding.  Be aware of how much alcohol is in your drink. In the [place] one drink equals one 12 oz bottle of beer (355 mL), one 5 oz glass of wine (148 mL), or one 1 oz glass of hard liquor (44 mL). General  instructions  Schedule regular health, dental, and eye exams.  Stay current with your vaccines.  Tell your health care provider if: ? You often feel depressed. ? You have ever been abused or do not feel safe at home. Summary  Adopting a healthy lifestyle and getting preventive care are important in promoting health and wellness.  Follow your health care provider's instructions about healthy diet, exercising, and getting tested or screened for diseases.  Follow your health care provider's instructions on monitoring your cholesterol and blood pressure. This information is not intended to replace advice given to you by your health care provider. Make sure you discuss any questions you have with your health care provider. Document Released: 02/25/2011 Document Revised: 08/05/2018 Document Reviewed: 08/05/2018 Elsevier Patient Education  2020 [employer]  

## 2019-03-16 ENCOUNTER — Other Ambulatory Visit (INDEPENDENT_AMBULATORY_CARE_PROVIDER_SITE_OTHER): Payer: 59

## 2019-03-16 ENCOUNTER — Other Ambulatory Visit: Payer: Self-pay | Admitting: Physician Assistant

## 2019-03-16 DIAGNOSIS — R7309 Other abnormal glucose: Secondary | ICD-10-CM | POA: Diagnosis not present

## 2019-03-16 LAB — HIV ANTIBODY (ROUTINE TESTING W REFLEX): HIV 1&2 Ab, 4th Generation: NONREACTIVE

## 2019-03-16 LAB — HEMOGLOBIN A1C: Hgb A1c MFr Bld: 6.7 % — ABNORMAL HIGH (ref 4.6–6.5)

## 2019-03-16 MED FILL — AMLODIPINE BESYLATE 10 MG T: 10 | 90 days supply | Qty: 90 | Fill #0

## 2019-03-16 NOTE — Addendum Note (Signed)
Addended by: Francis Dowse T on: 03/16/2019 01:51 PM   Modules accepted: Orders

## 2019-04-05 ENCOUNTER — Encounter: Payer: Self-pay | Admitting: Certified Nurse Midwife

## 2019-04-15 ENCOUNTER — Other Ambulatory Visit: Payer: Self-pay

## 2019-04-16 ENCOUNTER — Ambulatory Visit (INDEPENDENT_AMBULATORY_CARE_PROVIDER_SITE_OTHER): Payer: 59

## 2019-04-16 ENCOUNTER — Other Ambulatory Visit: Payer: Self-pay

## 2019-04-16 ENCOUNTER — Telehealth: Payer: Self-pay | Admitting: *Deleted

## 2019-04-16 ENCOUNTER — Other Ambulatory Visit: Payer: Self-pay | Admitting: Podiatry

## 2019-04-16 ENCOUNTER — Ambulatory Visit (INDEPENDENT_AMBULATORY_CARE_PROVIDER_SITE_OTHER): Payer: 59 | Admitting: Podiatry

## 2019-04-16 VITALS — Temp 97.4°F

## 2019-04-16 DIAGNOSIS — Q279 Congenital malformation of peripheral vascular system, unspecified: Secondary | ICD-10-CM

## 2019-04-16 DIAGNOSIS — M79671 Pain in right foot: Secondary | ICD-10-CM

## 2019-04-16 DIAGNOSIS — M25371 Other instability, right ankle: Secondary | ICD-10-CM

## 2019-04-16 DIAGNOSIS — M67471 Ganglion, right ankle and foot: Secondary | ICD-10-CM | POA: Diagnosis not present

## 2019-04-16 DIAGNOSIS — D2121 Benign neoplasm of connective and other soft tissue of right lower limb, including hip: Secondary | ICD-10-CM | POA: Diagnosis not present

## 2019-04-16 DIAGNOSIS — I729 Aneurysm of unspecified site: Secondary | ICD-10-CM | POA: Diagnosis not present

## 2019-04-16 NOTE — Telephone Encounter (Signed)
Faxed orders to [facility] Main Scheduling. 

## 2019-04-16 NOTE — Telephone Encounter (Signed)
Dr. March Rummage ordered referral to PT for right ankle stability for gait and balance with strength training of right ankle.

## 2019-04-16 NOTE — Telephone Encounter (Signed)
-----   Message from Evelina Bucy, DPM sent at 04/16/2019 10:57 AM EDT ----- MRI R foot with and without contrast

## 2019-04-16 NOTE — Telephone Encounter (Signed)
Ok that's fine

## 2019-04-19 ENCOUNTER — Encounter: Payer: Self-pay | Admitting: Certified Nurse Midwife

## 2019-04-19 ENCOUNTER — Other Ambulatory Visit (HOSPITAL_COMMUNITY)
Admission: RE | Admit: 2019-04-19 | Discharge: 2019-04-19 | Disposition: A | Discharge status: Home / Self Care | Payer: 59 | Source: Ambulatory Visit | Attending: Certified Nurse Midwife | Admitting: Certified Nurse Midwife

## 2019-04-19 ENCOUNTER — Ambulatory Visit (INDEPENDENT_AMBULATORY_CARE_PROVIDER_SITE_OTHER): Payer: 59 | Admitting: Certified Nurse Midwife

## 2019-04-19 ENCOUNTER — Other Ambulatory Visit: Payer: Self-pay

## 2019-04-19 VITALS — BP 124/84 | HR 68 | Temp 97.1°F | Resp 16 | Ht 66.5 in | Wt 309.0 lb

## 2019-04-19 DIAGNOSIS — E663 Overweight: Secondary | ICD-10-CM | POA: Diagnosis not present

## 2019-04-19 DIAGNOSIS — Z01419 Encounter for gynecological examination (general) (routine) without abnormal findings: Secondary | ICD-10-CM

## 2019-04-19 DIAGNOSIS — Z124 Encounter for screening for malignant neoplasm of cervix: Secondary | ICD-10-CM | POA: Diagnosis not present

## 2019-04-19 NOTE — Progress Notes (Signed)
38 y.o. 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 Periods normal,monthly 3-5 days with mild cramping,moderate light. Contraception  Condoms. Sees PCP for hypertension/labs and aex.Currently on stable medication for hypertension. Denies any vaginal discharge issues or pelvic pain. No other health concerns today.  Patient's last menstrual period was 04/01/2019 (exact date).          Sexually active: Yes.    The current method of family planning is condoms most of the time.    Exercising: Yes.    walking Smoker:  no  Review of Systems  Constitutional: Negative.   HENT: Negative.   Eyes: Negative.   Respiratory: Negative.   Cardiovascular: Negative.   Gastrointestinal: Negative.   Genitourinary: Negative.   Musculoskeletal: Negative.   Skin: Negative.   Neurological: Negative.   Endo/Heme/Allergies: Negative.   Psychiatric/Behavioral: Negative.     Health Maintenance: Pap:  none History of Abnormal Pap: no MMG:  none Self Breast exams: no Colonoscopy:  none BMD:   none TDaP:  2011 Shingles: no Pneumonia: no Hep C and HIV: HIV neg 2020 Labs: with PCP   reports that she has never smoked. She has never used smokeless tobacco. She reports current alcohol use. She reports that she does not use drugs.  Past Medical History:  Diagnosis Date  . Anemia   . Frequent headaches   . GERD (gastroesophageal reflux disease)   . Hypertension     Past Surgical History:  Procedure Laterality Date  . CESAREAN SECTION      Current Outpatient Medications  Medication Sig Dispense Refill  . acetaminophen (TYLENOL) 500 MG tablet Take 1,000-2,000 mg by mouth daily.    Marland Kitchen amLODipine (NORVASC) 10 MG tablet Take 1 tablet (10 mg total) by mouth daily. 90 tablet 1  . Diclofenac Sodium 1.5 % SOLN Use 3-4 times per day as needed. 150 mL 3  . famotidine (PEPCID) 10 MG tablet Take 10 mg by mouth daily. OTC    . MELATONIN PO Take by mouth.     No current facility-administered  medications for this visit.     Family History  Problem Relation Age of Onset  . Hypertension Mother   . Hypertension Father     ROS:  Pertinent items are noted in HPI.  Otherwise, a comprehensive ROS was negative.  Exam:   BP 124/84   Pulse 68   Temp (!) 97.1 F (36.2 C) (Skin)   Resp 16   Ht 5' 6.5" (1.689 m)   Wt (!) 309 lb (140.2 kg)   LMP 04/01/2019 (Exact Date)   BMI 49.13 kg/m  Height: 5' 6.5" (168.9 cm) Ht Readings from Last 3 Encounters:  04/19/19 5' 6.5" (1.689 m)  03/15/19 5\' 7"  (1.702 m)  09/16/17 6\' 8"  (2.032 m)    General appearance: alert, cooperative and appears stated age Head: Normocephalic, without obvious abnormality, atraumatic Neck: no adenopathy, supple, symmetrical, trachea midline and thyroid normal to inspection and palpation Lungs: clear to auscultation bilaterally Breasts: normal appearance, no masses or tenderness, No nipple retraction or dimpling, No nipple discharge or bleeding, No axillary or supraclavicular adenopathy Heart: regular rate and rhythm Abdomen: soft, non-tender; no masses,  no organomegaly Extremities: extremities normal, atraumatic, no cyanosis or edema Skin: Skin color, texture, turgor normal. No rashes or lesions Lymph nodes: Cervical, supraclavicular, and axillary nodes normal. No abnormal inguinal nodes palpated Neurologic: Grossly normal   Pelvic: External genitalia:  no lesions  Urethra:  normal appearing urethra with no masses, tenderness or lesions              Bartholin's and Skene's: normal                 Vagina: normal appearing vagina with normal color and discharge, no lesions              Cervix: multiparous appearance, no bleeding following Pap, no cervical motion tenderness and no lesions              Pap taken: Yes.   Bimanual Exam:  Uterus:  normal size, contour, position, consistency, mobility, non-tender and mid position              Adnexa: normal adnexa and no mass, fullness,  tenderness               Rectovaginal: Confirms               Anus:  normal sphincter tone, no lesions  Chaperone present: yes  A:  Well Woman with normal exam  Contraception condoms  Overweight and working on diet  Hypertension with PCP management  P:   Reviewed health and wellness pertinent to exam  Discussed consistent use for contraception  Discussed continuing her journey with weight management.  Continue follow up with PCP as indicated.  Pap smear: yes   counseled on breast self exam, feminine hygiene, adequate intake of calcium and vitamin D, diet and exercise  return annually or prn  An After Visit Summary was printed and given to the patient.

## 2019-04-19 NOTE — Patient Instructions (Signed)

## 2019-04-21 LAB — CYTOLOGY - PAP
Diagnosis: NEGATIVE
HPV: NOT DETECTED

## 2019-04-30 ENCOUNTER — Other Ambulatory Visit: Payer: Self-pay

## 2019-04-30 ENCOUNTER — Other Ambulatory Visit: Payer: Self-pay | Admitting: Podiatry

## 2019-04-30 ENCOUNTER — Ambulatory Visit (HOSPITAL_COMMUNITY)
Admission: RE | Admit: 2019-04-30 | Discharge: 2019-04-30 | Disposition: A | Discharge status: Home / Self Care | Payer: 59 | Source: Ambulatory Visit | Attending: Podiatry | Admitting: Podiatry

## 2019-04-30 DIAGNOSIS — M25371 Other instability, right ankle: Secondary | ICD-10-CM

## 2019-04-30 DIAGNOSIS — I729 Aneurysm of unspecified site: Secondary | ICD-10-CM | POA: Diagnosis not present

## 2019-04-30 DIAGNOSIS — Q279 Congenital malformation of peripheral vascular system, unspecified: Secondary | ICD-10-CM | POA: Insufficient documentation

## 2019-04-30 DIAGNOSIS — M25871 Other specified joint disorders, right ankle and foot: Secondary | ICD-10-CM | POA: Diagnosis not present

## 2019-05-05 ENCOUNTER — Ambulatory Visit (HOSPITAL_COMMUNITY): Payer: 59

## 2019-05-23 NOTE — Progress Notes (Signed)
  Subjective:  Patient ID: Linda [name], female    DOB: March 30, 1981,  MRN: [id number]  Chief Complaint  Patient presents with  . Ankle Pain    Pt states ankle instability/pain after working for a long time. Pt states 2 year duration, no known injuries.  . Foot Problem    Pt states skin nodule/lesion dorsal foot proximal 2nd digit, painful if pressed on, had since a teenager. No known injuries.    38 y.o. female presents with the above complaint.  History above confirmed with patient.  Review of Systems: Negative except as noted in the HPI. Denies N/V/F/Ch.  Past Medical History:  Diagnosis Date  . Anemia   . Frequent headaches   . GERD (gastroesophageal reflux disease)   . Hypertension     Current Outpatient Medications:  .  acetaminophen (TYLENOL) 500 MG tablet, Take 1,000-2,000 mg by mouth daily., Disp: , Rfl:  .  amLODipine (NORVASC) 10 MG tablet, Take 1 tablet (10 mg total) by mouth daily., Disp: 90 tablet, Rfl: 1 .  Diclofenac Sodium 1.5 % SOLN, Use 3-4 times per day as needed., Disp: 150 mL, Rfl: 3 .  famotidine (PEPCID) 10 MG tablet, Take 10 mg by mouth daily. OTC, Disp: , Rfl:  .  MELATONIN PO, Take by mouth., Disp: , Rfl:   Social History   Tobacco Use  Smoking Status Never Smoker  Smokeless Tobacco Never Used    No Known Allergies Objective:   Vitals:   04/16/19 1042  Temp: (!) 97.4 F (36.3 C)   There is no height or weight on file to calculate BMI. Constitutional Well developed. Well nourished.  Vascular Dorsalis pedis pulses palpable bilaterally. Posterior tibial pulses palpable bilaterally. Capillary refill normal to all digits.  No cyanosis or clubbing noted. Pedal hair growth normal.  Neurologic Normal speech. Oriented to person, place, and time. Epicritic sensation to light touch grossly present bilaterally.  Dermatologic Nails well groomed and normal in appearance. No open wounds. Firm nodule over the right second toe PIPJ  nonmovable  Orthopedic:  Pain palpation about the right ankle about the ATFL.  Negative anterior drawer negative talar tilt   Radiographs: Taken reviewed no acute fractures locations Assessment:   1. Ankle instability, right   2. 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 Fibroma of toe of right foot   5. Ganglion of foot, right    Plan:  Patient was evaluated and treated and all questions answered.  Right second toe fibroma versus ganglion versus vascular malformation -X-rays reviewed -Educated possible etiologies -Order MRI for further evaluation  Right ankle -Dispensed Tri-Lock brace Refer to PT-  Return in about 6 weeks (around 05/28/2019) for Ankle instabilit .

## 2019-05-28 ENCOUNTER — Ambulatory Visit: Payer: 59 | Admitting: Podiatry

## 2019-06-16 ENCOUNTER — Other Ambulatory Visit: Payer: Self-pay

## 2019-06-16 ENCOUNTER — Encounter: Payer: Self-pay | Admitting: Family Medicine

## 2019-06-16 MED ORDER — DICLOFENAC SODIUM 1.5 % TD SOLN: TRANSDERMAL | 3 refills | Status: DC

## 2019-06-16 MED ORDER — AMLODIPINE BESYLATE 10 MG PO TABS: 10.0000 mg | ORAL_TABLET | Freq: Every day | ORAL | 1 refills | Status: DC

## 2019-06-16 MED FILL — DICLOFENAC 1.5% TOPICAL SOL: 1.5 | 30 days supply | Qty: 150 | Fill #0

## 2019-06-16 MED FILL — AMLODIPINE BESYLATE 10 MG T: 10 | 90 days supply | Qty: 90 | Fill #0

## 2019-06-25 MED FILL — AMLODIPINE BESYLATE 10 MG T: 10 | 90 days supply | Qty: 90 | Fill #1

## 2019-08-11 ENCOUNTER — Encounter: Payer: Self-pay | Admitting: Family Medicine

## 2019-08-11 ENCOUNTER — Other Ambulatory Visit: Payer: Self-pay

## 2019-08-12 ENCOUNTER — Other Ambulatory Visit: Payer: Self-pay

## 2019-08-12 MED ORDER — DICLOFENAC SODIUM 1.5 % EX SOLN: 1.0000 mL | Freq: Three times a day (TID) | CUTANEOUS | 3 refills | Status: DC | PRN

## 2019-08-13 ENCOUNTER — Encounter: Payer: Self-pay | Admitting: Family Medicine

## 2019-08-13 ENCOUNTER — Other Ambulatory Visit: Payer: Self-pay

## 2019-09-23 ENCOUNTER — Encounter: Payer: Self-pay | Admitting: Family Medicine

## 2019-09-24 MED FILL — AMLODIPINE BESYLATE 10 MG T: 10 | 90 days supply | Qty: 90 | Fill #0

## 2019-10-31 ENCOUNTER — Encounter: Payer: Self-pay | Admitting: Family Medicine

## 2019-11-01 NOTE — Telephone Encounter (Signed)
Scheduled appt for pt to see Dr. Jerline Pain

## 2019-11-01 NOTE — Telephone Encounter (Signed)
Please call for app  

## 2019-11-12 ENCOUNTER — Encounter: Payer: Self-pay | Admitting: Certified Nurse Midwife

## 2019-11-19 ENCOUNTER — Ambulatory Visit: Payer: 59 | Admitting: Family Medicine

## 2019-11-19 ENCOUNTER — Other Ambulatory Visit: Payer: Self-pay

## 2019-11-19 ENCOUNTER — Encounter: Payer: Self-pay | Admitting: Family Medicine

## 2019-11-19 VITALS — BP 136/80 | HR 66 | Temp 98.2°F | Ht 66.5 in | Wt 307.0 lb

## 2019-11-19 DIAGNOSIS — M2550 Pain in unspecified joint: Secondary | ICD-10-CM

## 2019-11-19 DIAGNOSIS — R7309 Other abnormal glucose: Secondary | ICD-10-CM

## 2019-11-19 DIAGNOSIS — E119 Type 2 diabetes mellitus without complications: Secondary | ICD-10-CM | POA: Insufficient documentation

## 2019-11-19 LAB — COMPREHENSIVE METABOLIC PANEL
ALT: 17 U/L (ref 0–35)
AST: 13 U/L (ref 0–37)
Albumin: 4.1 g/dL (ref 3.5–5.2)
Alkaline Phosphatase: 89 U/L (ref 39–117)
BUN: 9 mg/dL (ref 6–23)
CO2: 28 mEq/L (ref 19–32)
Calcium: 9.3 mg/dL (ref 8.4–10.5)
Chloride: 102 mEq/L (ref 96–112)
Creatinine, Ser: 0.68 mg/dL (ref 0.40–1.20)
GFR: 117.64 mL/min (ref >60.00)
Glucose, Bld: 100 mg/dL — ABNORMAL HIGH (ref 70–99)
Potassium: 4.1 mEq/L (ref 3.5–5.1)
Sodium: 137 mEq/L (ref 135–145)
Total Bilirubin: 0.2 mg/dL (ref 0.2–1.2)
Total Protein: 7.6 g/dL (ref 6.0–8.3)

## 2019-11-19 LAB — CBC
HCT: 35.3 % — ABNORMAL LOW (ref 36.0–46.0)
Hemoglobin: 11.2 g/dL — ABNORMAL LOW (ref 12.0–15.0)
MCHC: 31.8 g/dL (ref 30.0–36.0)
MCV: 74 fl — ABNORMAL LOW (ref 78.0–100.0)
Platelets: 356 10*3/uL (ref 150.0–400.0)
RBC: 4.76 Mil/uL (ref 3.87–5.11)
RDW: 18.5 % — ABNORMAL HIGH (ref 11.5–15.5)
WBC: 5.7 10*3/uL (ref 4.0–10.5)

## 2019-11-19 LAB — C-REACTIVE PROTEIN: CRP: <1 mg/dL (ref 0.5–20.0)

## 2019-11-19 LAB — HEMOGLOBIN A1C: Hgb A1c MFr Bld: 6.3 % (ref 4.6–6.5)

## 2019-11-19 LAB — SEDIMENTATION RATE: Sed Rate: 71 mm/hr — ABNORMAL HIGH (ref 0–20)

## 2019-11-19 LAB — TSH: TSH: 2.87 u[IU]/mL (ref 0.35–4.50)

## 2019-11-19 MED ORDER — OZEMPIC (0.25 OR 0.5 MG/DOSE) 2 MG/1.5ML ~~LOC~~ SOPN: 0.2500 mg | PEN_INJECTOR | SUBCUTANEOUS | 5 refills | Status: DC

## 2019-11-19 MED ORDER — MELOXICAM 15 MG PO TABS: 15.0000 mg | ORAL_TABLET | Freq: Every day | ORAL | 0 refills | Status: DC

## 2019-11-19 MED FILL — MELOXICAM 15 MG TABLET: 15 | 30 days supply | Qty: 30 | Fill #0

## 2019-11-19 MED FILL — OZEMPIC 0.25 OR 0.5 MG/DOSE: 2 | 56 days supply | Qty: 2 | Fill #0

## 2019-11-19 NOTE — Assessment & Plan Note (Signed)
BMI 48 today.  We will start Ozempic as noted above.  She will check in with me in a couple of weeks and we will increase dose as tolerated.  She has very limited capacity to exercise at this point due to diffuse joint pain.  She will follow-up with  nutritionist to discuss diet.

## 2019-11-19 NOTE — Assessment & Plan Note (Signed)
Last A1c 6.7.  We will start Ozempic 0.25 mg weekly.  Recheck A1c today.  Discussed potential side effects.  Will likely need to recheck A1c in 3 to 6 months.

## 2019-11-19 NOTE — Patient Instructions (Signed)
It was very nice to see you today!  We will start Ozempic.  This will help with your weight loss.  Please take 0.25 mg once weekly.  Check in with me in a couple weeks to let me know how it is working for you.  You have a lot of inflammation in your joints.  Please take the meloxicam once daily for the next couple of weeks.  We will check blood work today.  Please schedule appointment with Aldona Bar to discuss diet and nutrition.  Take care, Dr Jerline Pain  Please try these tips to maintain a healthy lifestyle:   Eat at least 3 REAL meals and 1-2 snacks per day.  Aim for no more than 5 hours between eating.  If you eat breakfast, please do so within one hour of getting up.    Each meal should contain half fruits/vegetables, one quarter protein, and one quarter carbs (no bigger than a computer mouse)   Cut down on sweet beverages. This includes juice, soda, and sweet tea.     Drink at least 1 glass of water with each meal and aim for at least 8 glasses per day   Exercise at least 150 minutes every week.

## 2019-11-19 NOTE — Assessment & Plan Note (Signed)
Likely multifactorial.  Likely has underlying osteoarthritis though weight is also playing a significant role.  Will start meloxicam 15 mg daily for the next couple of weeks.  She will continue taking Tylenol as well.  Discussed referral to orthopedics or sports medicine however she deferred for today.  Check labs including CBC, CMP, TSH, CRP, and sed rate.  Have will have some improvement as she loses weight.

## 2019-11-19 NOTE — Progress Notes (Signed)
   Linda [name] is a 39 y.o. female who presents today for an office visit.  Assessment/Plan:  Chronic Problems Addressed Today: Elevated glucose Last A1c 6.7.  We will start Ozempic 0.25 mg weekly.  Recheck A1c today.  Discussed potential side effects.  Will likely need to recheck A1c in 3 to 6 months.  Polyarthralgia Likely multifactorial.  Likely has underlying osteoarthritis though weight is also playing a significant role.  Will start meloxicam 15 mg daily for the next couple of weeks.  She will continue taking Tylenol as well.  Discussed referral to orthopedics or sports medicine however she deferred for today.  Check labs including CBC, CMP, TSH, CRP, and sed rate.  Have will have some improvement as she loses weight.  Morbid obesity ([place]) BMI 48 today.  We will start Ozempic as noted above.  She will check in with me in a couple of weeks and we will increase dose as tolerated.  She has very limited capacity to exercise at this point due to diffuse joint pain.  She will follow-up with  nutritionist to discuss diet.     Subjective:  HPI:  Patient here for follow-up.  Last seen about 6 months ago.  Since then she has had worsening joint pain.  Pain predominantly located in right ankle though has bilateral ankle pain bilateral hip pain, bilateral knee pain.  She also start developed bilateral shoulder pain as well.  She has taken Tylenol with modest improvement.  Symptoms seem to be worsening.  Her pain makes it very difficult for her to ambulate or be active.  Is also significantly impacting her quality of life.  She is interested in starting medication to help with weight loss.       Objective:  Physical Exam: BP 136/80   Pulse 66   Temp 98.2 F (36.8 C)   Ht 5' 6.5" (1.689 m)   Wt (!) 307 lb (139.3 kg)   SpO2 98%   BMI 48.81 kg/m   Wt Readings from Last 3 Encounters:  11/19/19 (!) 307 lb (139.3 kg)  04/19/19 (!) 309 lb (140.2 kg)  03/15/19 (!) 306 lb 6.1 oz  (139 kg)  Gen: No acute distress, resting comfortably CV: Regular rate and rhythm with no murmurs appreciated Pulm: Normal work of breathing, clear to auscultation bilaterally with no crackles, wheezes, or rhonchi Neuro: Grossly normal, moves all extremities Psych: Normal affect and thought content      Orian Figueira M. Jerline Pain, MD 11/19/2019 10:26 AM

## 2019-11-22 NOTE — Progress Notes (Signed)
Please inform patient of the following:  Her inflammatory markers are elevated but the rest of her blood work is NORMAL. Do not need to make any changes to her treatment plan at this time. Would like for her to check in with [place] in a couple weeks as we discussed at her office visit.

## 2019-12-08 ENCOUNTER — Encounter: Payer: Self-pay | Admitting: Family Medicine

## 2019-12-09 ENCOUNTER — Other Ambulatory Visit: Payer: Self-pay | Admitting: Family Medicine

## 2019-12-09 NOTE — Telephone Encounter (Signed)
Please advise 

## 2019-12-14 MED FILL — MELOXICAM 15 MG TABLET: 15 | 30 days supply | Qty: 30 | Fill #0

## 2019-12-16 MED FILL — AMLODIPINE BESYLATE 10 MG T: 10 | 90 days supply | Qty: 90 | Fill #1

## 2020-01-17 ENCOUNTER — Other Ambulatory Visit: Payer: Self-pay | Admitting: *Deleted

## 2020-01-17 ENCOUNTER — Encounter: Payer: Self-pay | Admitting: Family Medicine

## 2020-01-17 MED ORDER — MELOXICAM 15 MG PO TABS: 15.0000 mg | ORAL_TABLET | Freq: Every day | ORAL | 0 refills | Status: DC

## 2020-01-17 MED FILL — MELOXICAM 15 MG TABLET: 15 | 30 days supply | Qty: 30 | Fill #0

## 2020-02-18 ENCOUNTER — Encounter: Payer: Self-pay | Admitting: Family Medicine

## 2020-02-18 ENCOUNTER — Other Ambulatory Visit: Payer: Self-pay | Admitting: *Deleted

## 2020-02-18 MED ORDER — MELOXICAM 15 MG PO TABS: 15.0000 mg | ORAL_TABLET | Freq: Every day | ORAL | 0 refills | Status: DC

## 2020-02-18 MED FILL — MELOXICAM 15 MG TABLET: 15 | 30 days supply | Qty: 30 | Fill #0

## 2020-03-15 ENCOUNTER — Other Ambulatory Visit: Payer: Self-pay | Admitting: Family Medicine

## 2020-03-15 ENCOUNTER — Encounter: Payer: Self-pay | Admitting: Family Medicine

## 2020-03-15 DIAGNOSIS — I10 Essential (primary) hypertension: Secondary | ICD-10-CM

## 2020-03-15 DIAGNOSIS — M25562 Pain in left knee: Secondary | ICD-10-CM

## 2020-03-16 ENCOUNTER — Other Ambulatory Visit: Payer: Self-pay | Admitting: Family Medicine

## 2020-03-16 MED ORDER — MELOXICAM 15 MG PO TABS: 15.0000 mg | ORAL_TABLET | Freq: Every day | ORAL | 0 refills | Status: DC

## 2020-03-16 MED ORDER — AMLODIPINE BESYLATE 10 MG PO TABS: 10.0000 mg | ORAL_TABLET | Freq: Every day | ORAL | 1 refills | Status: DC

## 2020-03-16 MED FILL — MELOXICAM 15 MG TABLET: 15 | 30 days supply | Qty: 30 | Fill #0

## 2020-03-16 MED FILL — AMLODIPINE BESYLATE 10 MG T: 10 | 90 days supply | Qty: 90 | Fill #0

## 2020-04-20 ENCOUNTER — Other Ambulatory Visit: Payer: Self-pay | Admitting: *Deleted

## 2020-04-20 ENCOUNTER — Encounter: Payer: Self-pay | Admitting: Family Medicine

## 2020-04-20 DIAGNOSIS — M25562 Pain in left knee: Secondary | ICD-10-CM

## 2020-04-20 MED ORDER — MELOXICAM 15 MG PO TABS: 15.0000 mg | ORAL_TABLET | Freq: Every day | ORAL | 0 refills | Status: DC

## 2020-04-20 MED FILL — MELOXICAM 15 MG TABLET: 15 | 30 days supply | Qty: 30 | Fill #0

## 2020-05-19 ENCOUNTER — Other Ambulatory Visit: Payer: Self-pay | Admitting: Family Medicine

## 2020-05-19 DIAGNOSIS — M25562 Pain in left knee: Secondary | ICD-10-CM

## 2020-05-19 NOTE — Telephone Encounter (Signed)
OK for refill? Looks like patient never followed up from back in March, but med has been refilled monthly since then.

## 2020-05-22 ENCOUNTER — Encounter: Payer: Self-pay | Admitting: Family Medicine

## 2020-05-22 MED ORDER — MELOXICAM 15 MG PO TABS: 15.0000 mg | ORAL_TABLET | Freq: Every day | ORAL | 0 refills | Status: DC

## 2020-05-22 NOTE — Telephone Encounter (Signed)
Pt requesting refill on Meloxicam 15 mg, okay to refill?

## 2020-05-23 ENCOUNTER — Other Ambulatory Visit: Payer: Self-pay | Admitting: *Deleted

## 2020-05-23 ENCOUNTER — Other Ambulatory Visit: Payer: Self-pay

## 2020-05-23 ENCOUNTER — Encounter: Payer: Self-pay | Admitting: Family Medicine

## 2020-05-23 DIAGNOSIS — M25562 Pain in left knee: Secondary | ICD-10-CM

## 2020-05-23 MED ORDER — MELOXICAM 15 MG PO TABS: 15.0000 mg | ORAL_TABLET | Freq: Every day | ORAL | 0 refills | Status: DC

## 2020-05-23 MED FILL — MELOXICAM 15 MG TABLET: 15 | 30 days supply | Qty: 30 | Fill #0

## 2020-06-19 ENCOUNTER — Encounter: Payer: Self-pay | Admitting: Family Medicine

## 2020-06-19 ENCOUNTER — Other Ambulatory Visit: Payer: Self-pay | Admitting: *Deleted

## 2020-06-19 DIAGNOSIS — M25562 Pain in left knee: Secondary | ICD-10-CM

## 2020-06-19 MED ORDER — MELOXICAM 15 MG PO TABS: 15.0000 mg | ORAL_TABLET | Freq: Every day | ORAL | 0 refills | Status: DC

## 2020-06-19 MED FILL — MELOXICAM 15 MG TABLET: 15 | 30 days supply | Qty: 30 | Fill #0

## 2020-06-21 MED FILL — AMLODIPINE BESYLATE 10 MG T: 10 | 90 days supply | Qty: 90 | Fill #1

## 2020-07-25 ENCOUNTER — Other Ambulatory Visit: Payer: Self-pay

## 2020-07-25 ENCOUNTER — Encounter: Payer: Self-pay | Admitting: Family Medicine

## 2020-07-25 DIAGNOSIS — M25562 Pain in left knee: Secondary | ICD-10-CM

## 2020-07-25 MED ORDER — MELOXICAM 15 MG PO TABS: 15.0000 mg | ORAL_TABLET | Freq: Every day | ORAL | 0 refills | Status: DC

## 2020-07-25 MED FILL — MELOXICAM 15 MG TABLET: 15 | 30 days supply | Qty: 30 | Fill #0

## 2020-08-29 ENCOUNTER — Other Ambulatory Visit: Payer: Self-pay | Admitting: Family Medicine

## 2020-08-29 ENCOUNTER — Encounter: Payer: Self-pay | Admitting: Family Medicine

## 2020-08-29 DIAGNOSIS — M25562 Pain in left knee: Secondary | ICD-10-CM

## 2020-08-29 MED ORDER — MELOXICAM 15 MG PO TABS: 15.0000 mg | ORAL_TABLET | Freq: Every day | ORAL | 0 refills | Status: DC

## 2020-08-30 ENCOUNTER — Other Ambulatory Visit: Payer: Self-pay

## 2020-08-30 DIAGNOSIS — M25562 Pain in left knee: Secondary | ICD-10-CM

## 2020-08-30 MED FILL — MELOXICAM 15 MG TABLET: 15 | 30 days supply | Qty: 30 | Fill #0

## 2020-09-25 ENCOUNTER — Other Ambulatory Visit: Payer: Self-pay | Admitting: *Deleted

## 2020-09-25 ENCOUNTER — Encounter: Payer: Self-pay | Admitting: Family Medicine

## 2020-09-25 DIAGNOSIS — M25562 Pain in left knee: Secondary | ICD-10-CM

## 2020-09-25 DIAGNOSIS — I10 Essential (primary) hypertension: Secondary | ICD-10-CM

## 2020-09-25 MED ORDER — MELOXICAM 15 MG PO TABS: 15.0000 mg | ORAL_TABLET | Freq: Every day | ORAL | 0 refills | Status: DC

## 2020-09-25 MED ORDER — AMLODIPINE BESYLATE 10 MG PO TABS: 10.0000 mg | ORAL_TABLET | Freq: Every day | ORAL | 0 refills | Status: DC

## 2020-09-25 MED FILL — MELOXICAM 15 MG TABLET: 15 | 30 days supply | Qty: 30 | Fill #0

## 2020-09-25 MED FILL — AMLODIPINE BESYLATE 10 MG T: 10 | 90 days supply | Qty: 90 | Fill #0

## 2020-09-29 ENCOUNTER — Other Ambulatory Visit: Payer: Self-pay

## 2020-09-29 ENCOUNTER — Ambulatory Visit (INDEPENDENT_AMBULATORY_CARE_PROVIDER_SITE_OTHER): Payer: 59

## 2020-09-29 ENCOUNTER — Ambulatory Visit (INDEPENDENT_AMBULATORY_CARE_PROVIDER_SITE_OTHER): Payer: 59 | Admitting: Family Medicine

## 2020-09-29 ENCOUNTER — Ambulatory Visit: Payer: 59 | Admitting: Family Medicine

## 2020-09-29 ENCOUNTER — Other Ambulatory Visit: Payer: Self-pay | Admitting: Family Medicine

## 2020-09-29 VITALS — BP 114/72 | HR 68 | Temp 98.3°F | Ht 66.5 in | Wt 314.0 lb

## 2020-09-29 DIAGNOSIS — I10 Essential (primary) hypertension: Secondary | ICD-10-CM | POA: Diagnosis not present

## 2020-09-29 DIAGNOSIS — M25562 Pain in left knee: Secondary | ICD-10-CM

## 2020-09-29 MED ORDER — PREDNISONE 50 MG PO TABS: ORAL_TABLET | ORAL | 0 refills | Status: DC

## 2020-09-29 MED ORDER — GABAPENTIN 300 MG PO CAPS: 300.0000 mg | ORAL_CAPSULE | Freq: Every day | ORAL | 3 refills | Status: DC

## 2020-09-29 MED FILL — GABAPENTIN 300 MG CAPSULE: 300 | 90 days supply | Qty: 90 | Fill #0

## 2020-09-29 MED FILL — predniSONE 50 MG TABS: 50 | 5 days supply | Qty: 5 | Fill #0

## 2020-09-29 NOTE — Assessment & Plan Note (Signed)
Likely has underlying osteoarthritis based on exam though history consistent and concerning for possible meniscal tear. She has not had any imaging done. Will check plain film today. Also place referral to sports medicine. She is already on meloxicam 15 mg daily. Will start prednisone 50 mg daily and also gabapentin 300 mg daily. Discussed potential side effects.

## 2020-09-29 NOTE — Progress Notes (Signed)
   Linda [name] is a 39 y.o. female who presents today for an office visit.  Assessment/Plan:  Chronic Problems Addressed Today: Morbid obesity ([place]) She has been on Ozempic 0.25 mg weekly for the last several months. She does admit she occasionally forgets to take a dose. She tolerated 0.25 mg weekly well. Will increase to 0.5 milligrams weekly. Discussed potential side effects.  Essential hypertension Doing much better on amlodipine 10 mg daily.  Left knee pain Likely has underlying osteoarthritis based on exam though history consistent and concerning for possible meniscal tear. She has not had any imaging done. Will check plain film today. Also place referral to sports medicine. She is already on meloxicam 15 mg daily. Will start prednisone 50 mg daily and also gabapentin 300 mg daily. Discussed potential side effects.     Subjective:  HPI:  Patient here with excruciating left knee pain for the last 3 weeks. Has a history of bilateral knee pain for the last couple of years. No obvious injuries or precipitating events. Pain much worse with standing. Much worse with going from sitting to standing. She has a lot of popping and locking in the area. Occasionally feels like it gives out. No noticed swelling.       Objective:  Physical Exam: BP 114/72   Pulse 68   Temp 98.3 F (36.8 C) (Temporal)   Ht 5' 6.5" (1.689 m)   Wt (!) 314 lb (142.4 kg)   SpO2 98%   BMI 49.92 kg/m   Wt Readings from Last 3 Encounters:  09/29/20 (!) 314 lb (142.4 kg)  11/19/19 (!) 307 lb (139.3 kg)  04/19/19 (!) 309 lb (140.2 kg)  Gen: No acute distress, resting comfortably CV: Regular rate and rhythm with no murmurs appreciated Pulm: Normal work of breathing, clear to auscultation bilaterally with no crackles, wheezes, or rhonchi MSK: - Knees: Crepitus noted with active and passive range of motion of bilateral knees left worse than right. No gross deformities. Some mid joint line tenderness  noted. Stable to varus and valgus stress. Anterior and posterior drawer signs negative. Neuro: Grossly normal, moves all extremities Psych: Normal affect and thought content      Lane Eland M. Jerline Pain, MD 09/29/2020 3:51 PM

## 2020-09-29 NOTE — Assessment & Plan Note (Signed)
Doing much better on amlodipine 10 mg daily.

## 2020-09-29 NOTE — Patient Instructions (Signed)
It was very nice to see you today!  Please start the prednisone and gabapentin.  We will check a knee x-ray. I am concerned that you may have a meniscal tear. I will place a referral for you to see the sports medicine doctor.  We will contact you next week with results on your x-ray.  Take care, Dr Jerline Pain  Please try these tips to maintain a healthy lifestyle:   Eat at least 3 REAL meals and 1-2 snacks per day.  Aim for no more than 5 hours between eating.  If you eat breakfast, please do so within one hour of getting up.    Each meal should contain half fruits/vegetables, one quarter protein, and one quarter carbs (no bigger than a computer mouse)   Cut down on sweet beverages. This includes juice, soda, and sweet tea.     Drink at least 1 glass of water with each meal and aim for at least 8 glasses per day   Exercise at least 150 minutes every week.

## 2020-09-29 NOTE — Assessment & Plan Note (Signed)
She has been on Ozempic 0.25 mg weekly for the last several months. She does admit she occasionally forgets to take a dose. She tolerated 0.25 mg weekly well. Will increase to 0.5 milligrams weekly. Discussed potential side effects.

## 2020-10-02 NOTE — Progress Notes (Signed)
Please inform patient of the following:  Xray shows no fracture or other acute issues. Would like for her to take the medications as we discussed at her office visit and would like for her to follow up with sports medicine soon.  Linda [name]. Jerline Pain, MD 10/02/2020 9:03 AM

## 2020-10-03 ENCOUNTER — Encounter: Payer: Self-pay | Admitting: Family Medicine

## 2020-10-05 ENCOUNTER — Telehealth: Payer: Self-pay

## 2020-10-05 ENCOUNTER — Encounter: Payer: Self-pay | Admitting: Family Medicine

## 2020-10-05 ENCOUNTER — Other Ambulatory Visit: Payer: Self-pay | Admitting: Family Medicine

## 2020-10-05 MED ORDER — PREDNISONE 50 MG PO TABS: ORAL_TABLET | ORAL | 0 refills | Status: DC

## 2020-10-05 MED FILL — predniSONE 50 MG TABS: 50 | 5 days supply | Qty: 5 | Fill #0

## 2020-10-05 NOTE — Addendum Note (Signed)
Addended by: Clyde Lundborg A on: 10/05/2020 02:51 PM   Modules accepted: Orders

## 2020-10-05 NOTE — Telephone Encounter (Signed)
Ok for refill? 

## 2020-10-05 NOTE — Telephone Encounter (Signed)
Refill sent.

## 2020-10-05 NOTE — Telephone Encounter (Signed)
[facility] for one more refill but she needs to see sports medicine soon.  Algis Greenhouse. Jerline Pain, MD 10/05/2020 2:28 PM

## 2020-10-05 NOTE — Telephone Encounter (Signed)
  LAST APPOINTMENT DATE: 09/29/2020   NEXT APPOINTMENT DATE:@Visit  date not found  MEDICATION: predniSONE (DELTASONE) 50 MG tablet  PHARMACY: [facility] [place]

## 2020-10-06 ENCOUNTER — Inpatient Hospital Stay: Payer: 59 | Attending: Family Medicine

## 2020-10-06 DIAGNOSIS — Z23 Encounter for immunization: Secondary | ICD-10-CM

## 2020-10-06 NOTE — Progress Notes (Signed)
Subjective:   I, Linda [name], LAT, ATC acting as a scribe for Lynne Leader, MD.  I'm seeing this patient as a consultation for Dr. Dimas Chyle. Note will be routed back to referring provider/PCP.  CC: Left knee pain  HPI: Pt is a 40 y/o female c/o bilat knee pain, L>R. Pt reports that 2-3 weeks ago L knee pain became excruciating. Pt locates L knee pain to all over knee that radiates into L Achilles. 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  She also notes some bilateral shoulder pain and other aches and pains across her body.  She denies any history of gout or other rheumatologic disease.  She notes in the past she did have an elevated sedimentation rate but is not aware why.  In addition she notes a history of anemia.  Radiates: yes Mechanical symptoms: yes- popping and locking L knee swelling: no Aggravates: transitioning from sitting to standing, standing Rx tried: Meloxicam $RemoveBeforeDE'15mg'qXrmAhiqoAzvIGH$ , Prednisone $RemoveBefo'50mg'bNkUJwfdKpN$ , Gabapentin $RemoveBefo'300mg'TfWJRnLnoLn$ , cream  Dx imaging: 09/29/20 L knee XR  Past medical history, Surgical history, Family history, Social history, Allergies, and medications have been entered into the medical record, reviewed.   Review of Systems: No new headache, visual changes, nausea, vomiting, diarrhea, constipation, dizziness, abdominal pain, skin rash, fevers, chills, night sweats, weight loss, swollen lymph nodes, body aches, joint swelling, muscle aches, chest pain, shortness of breath, mood changes, visual or auditory hallucinations.   Objective:    Vitals:   10/09/20 1544  BP: 134/78  Pulse: 63  SpO2: 98%   General: Well Developed, well nourished, and in no acute distress.  Neuro/Psych: Alert and oriented x3, extra-ocular muscles intact, able to move all 4 extremities, sensation grossly intact. Skin: Warm and dry, no rashes noted.  Respiratory: Not using accessory muscles, speaking in full sentences, trachea midline.  Cardiovascular: Pulses palpable, no  extremity edema. Abdomen: Does not appear distended. MSK: Left knee large body habitus mild effusion. Normal motion with crepitation. Tender palpation medial and lateral joint line. Stable ligamentous exam. Negative Murray's test. Intact strength.  Lab and Radiology Results DG Knee 1-2 Views Left  Result Date: 09/30/2020 CLINICAL DATA:  Knee pain EXAM: LEFT KNEE - 1-2 VIEW COMPARISON:  None. FINDINGS: No acute fracture or dislocation. No significant tricompartmental degenerative changes. Enthesophyte of the patellar tendon insertion on the patella. No area of erosion or osseous destruction. No unexpected radiopaque foreign body. Soft tissues are unremarkable. IMPRESSION: No acute findings. Electronically Signed   By: Valentino Saxon MD   On: 09/30/2020 19:42   I, Lynne Leader, personally (independently) visualized and performed the interpretation of the images attached in this note.  Procedure: Real-time Ultrasound Guided Injection of left knee superior lateral patellar space Device: Philips Affiniti 50G Images permanently stored and available for review in PACS Inspection prior to injection with ultrasound reveals moderate joint effusion.  Degenerative medial and lateral joint lines.  No Baker's cyst. Verbal informed consent obtained.  Discussed risks and benefits of procedure. Warned about infection bleeding damage to structures skin hypopigmentation and fat atrophy among others. Patient expresses understanding and agreement Time-out conducted.   Noted no overlying erythema, induration, or other signs of local infection.   Skin prepped in a sterile fashion.   Local anesthesia: Topical Ethyl chloride.   With sterile technique and under real time ultrasound guidance:  40 mg of Kenalog and 2 mL of Marcaine injected into knee joint. Fluid seen entering the  capsule.   Completed without difficulty   Pain moderately resolved suggesting accurate placement of the medication.   Advised to call  if fevers/chills, erythema, induration, drainage, or persistent bleeding.   Images permanently stored and available for review in the ultrasound unit.  Impression: Technically successful ultrasound guided injection.       Impression and Recommendations:    Assessment and Plan: 40 y.o. female with left knee pain.  Etiology remains somewhat unclear.  Patient had mild to moderate chronic pain in the knee prior to developing severe pain recently.  This is associated with other diffuse arthralgias and myalgias.  She does not have enough arthritis visible on x-ray to explain the severity of her pain.  Physical exam also does not reveal mechanical or ligamentous problem to explain her pain.  Concerned there may be a rheumatologic or inflammatory condition.  Plan for limited rheumatologic evaluation was below including uric acid evaluation.  Fortunately she is currently finishing up her prednisone course I cannot proceed with this significant rheumatologic work-up as it will likely be falsely negative at this time.  However gout work-up should be helpful now.  We will also proceed with steroid injection and recheck in 2 weeks.Marland Kitchen  PDMP not reviewed this encounter. Orders Placed This Encounter  Procedures  . [place] LIMITED JOINT SPACE STRUCTURES LOW LEFT(NO LINKED CHARGES)    Standing Status:   Future    Number of Occurrences:   1    Standing Expiration Date:   04/08/2021    Order Specific Question:   Reason for Exam (SYMPTOM  OR DIAGNOSIS REQUIRED)    Answer:   left knee pain    Order Specific Question:   Preferred imaging location?    Answer:   [facility]  . Uric acid    Standing Status:   Future    Number of Occurrences:   1    Standing Expiration Date:   10/09/2021  . Comp Met (CMET)    Standing Status:   Future    Number of Occurrences:   1    Standing Expiration Date:   10/09/2021  . CBC    Standing Status:   Future    Number of Occurrences:   1    Standing  Expiration Date:   10/09/2021   No orders of the defined types were placed in this encounter.   Discussed warning signs or symptoms. Please see discharge instructions. Patient expresses understanding.   The above documentation has been reviewed and is accurate and complete Lynne Leader, M.D.

## 2020-10-09 ENCOUNTER — Other Ambulatory Visit: Payer: Self-pay

## 2020-10-09 ENCOUNTER — Ambulatory Visit (INDEPENDENT_AMBULATORY_CARE_PROVIDER_SITE_OTHER): Payer: 59 | Admitting: Family Medicine

## 2020-10-09 ENCOUNTER — Ambulatory Visit: Payer: Self-pay

## 2020-10-09 VITALS — BP 134/78 | HR 63 | Ht 66.5 in | Wt 314.0 lb

## 2020-10-09 DIAGNOSIS — M25562 Pain in left knee: Secondary | ICD-10-CM

## 2020-10-09 DIAGNOSIS — M25462 Effusion, left knee: Secondary | ICD-10-CM

## 2020-10-09 NOTE — Patient Instructions (Addendum)
Thank you for coming in today.  Please get labs today before you leave  Recheck in 2 weeks.   I think this may be gout but I am not sure.

## 2020-10-10 ENCOUNTER — Encounter: Payer: Self-pay | Admitting: Family Medicine

## 2020-10-10 LAB — COMPREHENSIVE METABOLIC PANEL
ALT: 86 U/L — ABNORMAL HIGH (ref 0–35)
AST: 39 U/L — ABNORMAL HIGH (ref 0–37)
Albumin: 3.8 g/dL (ref 3.5–5.2)
Alkaline Phosphatase: 98 U/L (ref 39–117)
BUN: 14 mg/dL (ref 6–23)
CO2: 26 mEq/L (ref 19–32)
Calcium: 8.9 mg/dL (ref 8.4–10.5)
Chloride: 101 mEq/L (ref 96–112)
Creatinine, Ser: 0.87 mg/dL (ref 0.40–1.20)
GFR: 84.68 mL/min (ref >60.00)
Glucose, Bld: 242 mg/dL — ABNORMAL HIGH (ref 70–99)
Potassium: 4 mEq/L (ref 3.5–5.1)
Sodium: 134 mEq/L — ABNORMAL LOW (ref 135–145)
Total Bilirubin: 0.2 mg/dL (ref 0.2–1.2)
Total Protein: 7.6 g/dL (ref 6.0–8.3)

## 2020-10-10 LAB — CBC
HCT: 37.6 % (ref 36.0–46.0)
Hemoglobin: 12 g/dL (ref 12.0–15.0)
MCHC: 32 g/dL (ref 30.0–36.0)
MCV: 75.4 fl — ABNORMAL LOW (ref 78.0–100.0)
Platelets: 314 10*3/uL (ref 150.0–400.0)
RBC: 4.99 Mil/uL (ref 3.87–5.11)
RDW: 18.1 % — ABNORMAL HIGH (ref 11.5–15.5)
WBC: 12.1 10*3/uL — ABNORMAL HIGH (ref 4.0–10.5)

## 2020-10-10 LAB — URIC ACID: Uric Acid, Serum: 3.5 mg/dL (ref 2.4–7.0)

## 2020-10-10 NOTE — Progress Notes (Signed)
Blood cell count is mildly elevated probably because of the prednisone.  Metabolic panel shows elevated blood sugar again probably because of the prednisone and a little bit of elevated liver enzymes.  Uric acid is normal however.  This indicates that gout is unlikely.

## 2020-10-19 NOTE — Progress Notes (Signed)
I, Wendy Poet, LAT, ATC, am serving as scribe for Dr. Lynne Leader.  Linda [name] is a 40 y.o. female who presents to [place] at [facility] today for f/u of B knee pain, L>R.  She was last seen by Dr. Georgina Snell on 10/09/20 and had a L knee injection.  Since her last visit, pt reports that the knee injection helped and states that it has taken her pain to a bearable level.  She denies any swelling in her knees but does note grinding in her B knees.  She would like to discuss both knees today.  She notes popping and catching and grinding clicking in both knees left worse than right.  Diagnostic testing: L knee XR- 09/29/20   Pertinent review of systems: No fevers or chills  Relevant historical information: Obesity, hypertension   Exam:  BP (!) 148/98 (BP Location: Right Arm, Patient Position: Sitting, Cuff Size: Large)   Pulse 68   Ht 5' 6.5" (1.689 m)   Wt (!) 310 lb 3.2 oz (140.7 kg)   LMP 10/20/2020   SpO2 98%   BMI 49.32 kg/m  General: Well Developed, well nourished, and in no acute distress.   MSK: Left knee normal motion crepitation with extension  Right knee normal motion crepitations with extension    Lab and Radiology Results  X-ray images right knee obtained today personally and independently interpreted  Mild DJD.  No acute fractures. Await formal radiology review    Assessment and Plan: 40 y.o. 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  Concern for meniscus tear as well based on prior examination.  Additionally there is also some concern for rheumatologic process.  Patient had mild response to steroid injection but is still reasonably painful.  We will proceed MRI to further characterize cause of pain.  Recheck after MRI.  Right knee pain thought to be more due to DJD.  Plan for x-ray evaluation today.  Additionally will start process for hyaluronic acid authorization for both knees.   PDMP  not reviewed this encounter. Orders Placed This Encounter  Procedures  . MR Knee Left  Wo Contrast    Standing Status:   Future    Standing Expiration Date:   10/23/2021    Order Specific Question:   What is the patient's sedation requirement?    Answer:   No Sedation    Order Specific Question:   Does the patient have a pacemaker or implanted devices?    Answer:   No    Order Specific Question:   Preferred imaging location?    Answer:   Product/process development scientist (table limit-350lbs)  . DG Knee AP/LAT W/Sunrise Right    Standing Status:   Future    Number of Occurrences:   1    Standing Expiration Date:   10/23/2021    Order Specific Question:   Reason for Exam (SYMPTOM  OR DIAGNOSIS REQUIRED)    Answer:   eval knee pain    Order Specific Question:   Is patient pregnant?    Answer:   No    Order Specific Question:   Preferred imaging location?    Answer:   [facility]   No orders of the defined types were placed in this encounter.    Discussed warning signs or symptoms. Please see discharge instructions. Patient expresses understanding.   The above documentation has been reviewed and is accurate and complete Lynne Leader, M.D.

## 2020-10-23 ENCOUNTER — Encounter: Payer: Self-pay | Admitting: Family Medicine

## 2020-10-23 ENCOUNTER — Ambulatory Visit: Payer: Self-pay

## 2020-10-23 ENCOUNTER — Other Ambulatory Visit: Payer: Self-pay

## 2020-10-23 ENCOUNTER — Ambulatory Visit (INDEPENDENT_AMBULATORY_CARE_PROVIDER_SITE_OTHER): Payer: 59 | Admitting: Family Medicine

## 2020-10-23 ENCOUNTER — Ambulatory Visit (INDEPENDENT_AMBULATORY_CARE_PROVIDER_SITE_OTHER): Payer: 59

## 2020-10-23 VITALS — BP 148/98 | HR 68 | Ht 66.5 in | Wt 310.2 lb

## 2020-10-23 DIAGNOSIS — M25561 Pain in right knee: Secondary | ICD-10-CM

## 2020-10-23 DIAGNOSIS — G8929 Other chronic pain: Secondary | ICD-10-CM

## 2020-10-23 DIAGNOSIS — M25562 Pain in left knee: Secondary | ICD-10-CM

## 2020-10-23 NOTE — Patient Instructions (Signed)
Thank you for coming in today.  You should hear from MRI scheduling within 1 week. If you do not hear please let me know.    Recheck following MRI   Please get an Xray today before you leave

## 2020-10-24 NOTE — Progress Notes (Signed)
X-ray right knee shows some bone spurs at the inferior and superior poles of the patella but nothing that looks new or acute.  If needed in the future MRI could be helpful.  However we will proceed with MRI of the other knee currently.

## 2020-10-29 ENCOUNTER — Other Ambulatory Visit: Payer: 59

## 2020-11-04 ENCOUNTER — Other Ambulatory Visit: Payer: 59

## 2020-11-07 ENCOUNTER — Encounter: Payer: Self-pay | Admitting: Family Medicine

## 2020-11-07 ENCOUNTER — Other Ambulatory Visit: Payer: Self-pay

## 2020-11-07 DIAGNOSIS — M25562 Pain in left knee: Secondary | ICD-10-CM

## 2020-11-07 MED ORDER — MELOXICAM 15 MG PO TABS: 15.0000 mg | ORAL_TABLET | Freq: Every day | ORAL | 0 refills | Status: DC

## 2020-11-11 ENCOUNTER — Other Ambulatory Visit: Payer: Self-pay

## 2020-11-11 ENCOUNTER — Ambulatory Visit (INDEPENDENT_AMBULATORY_CARE_PROVIDER_SITE_OTHER): Payer: 59

## 2020-11-11 DIAGNOSIS — G8929 Other chronic pain: Secondary | ICD-10-CM

## 2020-11-11 DIAGNOSIS — M25562 Pain in left knee: Secondary | ICD-10-CM | POA: Diagnosis not present

## 2020-11-13 NOTE — Progress Notes (Signed)
MRI of the knee shows a small tear of the meniscus.  A little bit of cartilage loss is present in the knee and the kneecap and irritating the bone under the kneecap.  Additionally a little bit of cartilage loss is present in the main knee joint.  Recommend return to clinic to discuss these results and discuss treatment plan in the near future.

## 2020-11-17 ENCOUNTER — Ambulatory Visit (INDEPENDENT_AMBULATORY_CARE_PROVIDER_SITE_OTHER): Payer: 59 | Admitting: Family Medicine

## 2020-11-17 ENCOUNTER — Ambulatory Visit: Payer: Self-pay

## 2020-11-17 ENCOUNTER — Other Ambulatory Visit: Payer: Self-pay

## 2020-11-17 ENCOUNTER — Encounter: Payer: Self-pay | Admitting: Family Medicine

## 2020-11-17 VITALS — BP 132/96 | HR 71 | Ht 66.5 in | Wt 309.6 lb

## 2020-11-17 DIAGNOSIS — M25561 Pain in right knee: Secondary | ICD-10-CM

## 2020-11-17 DIAGNOSIS — G8929 Other chronic pain: Secondary | ICD-10-CM

## 2020-11-17 DIAGNOSIS — M25562 Pain in left knee: Secondary | ICD-10-CM

## 2020-11-17 NOTE — Patient Instructions (Signed)
Thank you for coming in today.  Schedule 1st bilateral gelsyn injection next week (Wedensday?) for 730 am  Text me at [phone] if the door is locked.

## 2020-11-17 NOTE — Progress Notes (Signed)
I, Peterson Lombard, LAT, ATC acting as a scribe for Lynne Leader, MD.  Linda [name] is a 40 y.o. female who presents to [place] at [facility] today for f/u of B knee pain, L>R and L knee MRI review. Pt was last seen by Dr. Georgina Snell on 10/23/20 and was advised to proceed to MRI to further characterize the cause of pain. She had a prior L knee injection on 10/09/20 w/ limited relief noted.  Today, pt reports that her pain has started again and she wants to discuss either getting gel shots or getting another steroid injection.  Pt has been authorized for B Gelsyn injections.  She states that both of her knees are bothering her equally at this point.   Dx imaging: 11/11/20 L knee MRI  10/23/20 R & L knee XR  09/29/20 L knee XR  Pertinent review of systems: No fevers or chills  Relevant historical information: Hypertension, obesity.   Exam:  BP (!) 132/96 (BP Location: Right Arm, Patient Position: Sitting, Cuff Size: Large)   Pulse 71   Ht 5' 6.5" (1.689 m)   Wt (!) 309 lb 9.6 oz (140.4 kg)   LMP 10/20/2020   SpO2 97%   BMI 49.22 kg/m  General: Well Developed, well nourished, and in no acute distress.   MSK: Knees bilaterally normal-appearing with normal motion with crepitation.    Lab and Radiology Results  EXAM: MRI OF THE LEFT KNEE WITHOUT CONTRAST  TECHNIQUE: Multiplanar, multisequence MR imaging of the knee was performed. No intravenous contrast was administered.  COMPARISON:  None.  FINDINGS: MENISCI  Medial: Small radial tear of the free edge of the posterior horn of the medial meniscus.  Lateral: Intact.  LIGAMENTS  Cruciates: ACL and PCL are intact.  Collaterals: Medial collateral ligament is intact. Lateral collateral ligament complex is intact.  CARTILAGE  Patellofemoral: Partial-thickness cartilage loss of the lateral patellar facet and patellar apex with subchondral reactive marrow edema.  Medial: Mild  partial-thickness cartilage loss of the medial femorotibial compartment.  Lateral:  No chondral defect.  JOINT: Small joint effusion. Mild edema in superolateral Hoffa's. No plical thickening.  POPLITEAL FOSSA: Popliteus tendon is intact. Tiny Baker's cyst.  EXTENSOR MECHANISM: Intact quadriceps tendon. Intact patellar tendon. Intact lateral patellar retinaculum. Intact medial patellar retinaculum. Intact MPFL.  BONES: No aggressive osseous lesion. No fracture or dislocation.  Other: No fluid collection or hematoma. Muscles are normal.  IMPRESSION: 1. Small radial tear of the free edge of the posterior horn of the medial meniscus. 2. Partial-thickness cartilage loss of the lateral patellar facet and patellar apex with subchondral reactive marrow edema. 3. Mild partial-thickness cartilage loss of the medial femorotibial compartment.   Electronically Signed   By: Kathreen Devoid   On: 11/12/2020 09:11  I, Lynne Leader, personally (independently) visualized and performed the interpretation of the images attached in this note.    Assessment and Plan: 40 y.o. female with bilateral knee pain left worse than right.  Patient does have a small meniscus tear however believe the majority of her pain is due to the chondromalacia seen on MRI.  She is a good candidate for hyaluronic acid injection.  These have been authorized and patient will scheduled to start the series starting next week.  Spent time discussing the MRI findings and plan with patient.  Agreed to proceed with the hyaluronic acid.    Discussed warning signs or symptoms. Please see discharge instructions. Patient expresses understanding.   The above documentation  has been reviewed and is accurate and complete Lynne Leader, M.D.  Total encounter time 20 minutes including face-to-face time with the patient and, reviewing past medical record, and charting on the date of service.   MRI findings discussion and treatment  plan.

## 2020-11-21 ENCOUNTER — Encounter: Payer: Self-pay | Admitting: Family Medicine

## 2020-11-22 ENCOUNTER — Ambulatory Visit: Payer: 59 | Admitting: Family Medicine

## 2020-12-01 ENCOUNTER — Other Ambulatory Visit (HOSPITAL_COMMUNITY): Payer: Self-pay

## 2020-12-13 ENCOUNTER — Other Ambulatory Visit (HOSPITAL_COMMUNITY): Payer: Self-pay

## 2020-12-13 ENCOUNTER — Other Ambulatory Visit: Payer: Self-pay | Admitting: Family Medicine

## 2020-12-13 ENCOUNTER — Encounter: Payer: Self-pay | Admitting: Family Medicine

## 2020-12-13 DIAGNOSIS — I10 Essential (primary) hypertension: Secondary | ICD-10-CM

## 2020-12-13 DIAGNOSIS — M25562 Pain in left knee: Secondary | ICD-10-CM

## 2020-12-13 MED ORDER — MELOXICAM 15 MG PO TABS
ORAL_TABLET | Freq: Every day | ORAL | 0 refills | Status: DC
  Filled 2020-12-13: qty 90, 90d supply, fill #0

## 2020-12-13 MED ORDER — AMLODIPINE BESYLATE 10 MG PO TABS
ORAL_TABLET | Freq: Every day | ORAL | 0 refills | Status: DC
  Filled 2020-12-13: qty 90, 90d supply, fill #0

## 2020-12-14 ENCOUNTER — Other Ambulatory Visit: Payer: Self-pay

## 2020-12-25 ENCOUNTER — Ambulatory Visit: Payer: Self-pay

## 2020-12-25 ENCOUNTER — Other Ambulatory Visit: Payer: Self-pay

## 2020-12-25 ENCOUNTER — Ambulatory Visit (INDEPENDENT_AMBULATORY_CARE_PROVIDER_SITE_OTHER): Payer: 59 | Admitting: Family Medicine

## 2020-12-25 DIAGNOSIS — M25562 Pain in left knee: Secondary | ICD-10-CM

## 2020-12-25 DIAGNOSIS — G8929 Other chronic pain: Secondary | ICD-10-CM

## 2020-12-25 DIAGNOSIS — M25561 Pain in right knee: Secondary | ICD-10-CM | POA: Diagnosis not present

## 2020-12-25 NOTE — Progress Notes (Signed)
[name] presents to clinic today for Gelsyn injection bilateral knees 1/3  Procedure: Real-time Ultrasound Guided Injection of right knee superior lateral patellar space Device: Philips Affiniti 50G Images permanently stored and available for review in PACS Verbal informed consent obtained.  Discussed risks and benefits of procedure. Warned about infection bleeding damage to structures skin hypopigmentation and fat atrophy among others. Patient expresses understanding and agreement Time-out conducted.   Noted no overlying erythema, induration, or other signs of local infection.   Skin prepped in a sterile fashion.   Local anesthesia: Topical Ethyl chloride.   With sterile technique and under real time ultrasound guidance:  Gelsyn 16.8 mg injected into knee joint. Fluid seen entering the joint capsule.   Completed without difficulty   Advised to call if fevers/chills, erythema, induration, drainage, or persistent bleeding.   Images permanently stored and available for review in the ultrasound unit.  Impression: Technically successful ultrasound guided injection.   Procedure: Real-time Ultrasound Guided Injection of left knee superior lateral patellar space Device: Philips Affiniti 50G Images permanently stored and available for review in PACS Verbal informed consent obtained.  Discussed risks and benefits of procedure. Warned about infection bleeding damage to structures skin hypopigmentation and fat atrophy among others. Patient expresses understanding and agreement Time-out conducted.   Noted no overlying erythema, induration, or other signs of local infection.   Skin prepped in a sterile fashion.   Local anesthesia: Topical Ethyl chloride.   With sterile technique and under real time ultrasound guidance:  Gelsyn 16.8 mg injected into knee joint. Fluid seen entering the joint capsule.   Completed without difficulty   Advised to call if fevers/chills, erythema, induration, drainage, or  persistent bleeding.   Images permanently stored and available for review in the ultrasound unit.  Impression: Technically successful ultrasound guided injection.   Lot number: [id number] both knees  Return in 1 week for Gelsyn injection 2/3 for both knees   Spinal needle used

## 2020-12-25 NOTE — Patient Instructions (Addendum)
Thank you for coming in today.  You received bilateral knee gel injections today. Call or go to the ER if you develop a large red swollen joint with extreme pain or oozing puss.   Schedule your next gel injections for next week sometime.

## 2021-01-01 ENCOUNTER — Ambulatory Visit (INDEPENDENT_AMBULATORY_CARE_PROVIDER_SITE_OTHER): Payer: 59 | Admitting: Family Medicine

## 2021-01-01 ENCOUNTER — Ambulatory Visit: Payer: Self-pay

## 2021-01-01 DIAGNOSIS — M25561 Pain in right knee: Secondary | ICD-10-CM | POA: Diagnosis not present

## 2021-01-01 DIAGNOSIS — G8929 Other chronic pain: Secondary | ICD-10-CM

## 2021-01-01 DIAGNOSIS — M25562 Pain in left knee: Secondary | ICD-10-CM

## 2021-01-01 NOTE — Progress Notes (Signed)
Linda [name] presents to clinic today for Gelsyn injection bilateral knees 2/3  Procedure: Real-time Ultrasound Guided Injection of right knee superior lateral patellar space Device: Philips Affiniti 50G Images permanently stored and available for review in PACS Verbal informed consent obtained.  Discussed risks and benefits of procedure. Warned about infection bleeding damage to structures skin hypopigmentation and fat atrophy among others. Patient expresses understanding and agreement Time-out conducted.   Noted no overlying erythema, induration, or other signs of local infection.   Skin prepped in a sterile fashion.   Local anesthesia: Topical Ethyl chloride.   With sterile technique and under real time ultrasound guidance:  Gelsyn 16.8 mg injected into knee joint. Fluid seen entering the joint capsule.   Completed without difficulty   Spinal needle used Advised to call if fevers/chills, erythema, induration, drainage, or persistent bleeding.   Images permanently stored and available for review in the ultrasound unit.  Impression: Technically successful ultrasound guided injection.   Procedure: Real-time Ultrasound Guided Injection of left knee superior lateral patellar space Device: Philips Affiniti 50G Images permanently stored and available for review in PACS Verbal informed consent obtained.  Discussed risks and benefits of procedure. Warned about infection bleeding damage to structures skin hypopigmentation and fat atrophy among others. Patient expresses understanding and agreement Time-out conducted.   Noted no overlying erythema, induration, or other signs of local infection.   Skin prepped in a sterile fashion.   Local anesthesia: Topical Ethyl chloride.   With sterile technique and under real time ultrasound guidance:  Gelsyn 16.8 mg injected into the knee joint. Fluid seen entering the joint capsule.   Completed without difficulty   Spinal needle used Advised to call if  fevers/chills, erythema, induration, drainage, or persistent bleeding.   Images permanently stored and available for review in the ultrasound unit.  Impression: Technically successful ultrasound guided injection.   Lot #[id number] both injections

## 2021-01-01 NOTE — Patient Instructions (Signed)
Thank you for coming in today.  You received gel injections today in both of your knees. Call or go to the ER if you develop a large red swollen joint with extreme pain or oozing puss.   Please scheduled your 3rd gel injection for next week.

## 2021-01-08 ENCOUNTER — Ambulatory Visit (INDEPENDENT_AMBULATORY_CARE_PROVIDER_SITE_OTHER): Payer: 59 | Admitting: Family Medicine

## 2021-01-08 ENCOUNTER — Other Ambulatory Visit: Payer: Self-pay

## 2021-01-08 ENCOUNTER — Ambulatory Visit: Payer: Self-pay

## 2021-01-08 VITALS — Wt 309.0 lb

## 2021-01-08 DIAGNOSIS — G8929 Other chronic pain: Secondary | ICD-10-CM | POA: Diagnosis not present

## 2021-01-08 DIAGNOSIS — M25562 Pain in left knee: Secondary | ICD-10-CM | POA: Diagnosis not present

## 2021-01-08 DIAGNOSIS — M25561 Pain in right knee: Secondary | ICD-10-CM | POA: Diagnosis not present

## 2021-01-08 NOTE — Patient Instructions (Addendum)
Thank you for coming in today.  Please follow up as needed.   It should continue to improve.   I would not expect no pain. Significantly less pain is possible. Moderately less pain is likely.   We can do this every 6 month.

## 2021-01-08 NOTE — Progress Notes (Signed)
[name] presents to clinic today for Gelsyn injection bilateral knees 3/3  Procedure: Real-time Ultrasound Guided Injection of right knee superior lateral patellar space Device: Philips Affiniti 50G Images permanently stored and available for review in PACS Verbal informed consent obtained.  Discussed risks and benefits of procedure. Warned about infection bleeding damage to structures skin hypopigmentation and fat atrophy among others. Patient expresses understanding and agreement Time-out conducted.   Noted no overlying erythema, induration, or other signs of local infection.   Skin prepped in a sterile fashion.   Local anesthesia: Topical Ethyl chloride.   With sterile technique and under real time ultrasound guidance:  Gelsyn 16.8 mg injected into knee joint. Fluid seen entering the joint capsule.   Completed without difficulty   Advised to call if fevers/chills, erythema, induration, drainage, or persistent bleeding.   Images permanently stored and available for review in the ultrasound unit.  Impression: Technically successful ultrasound guided injection.     Procedure: Real-time Ultrasound Guided Injection of left knee superior lateral patellar space Device: Philips Affiniti 50G Images permanently stored and available for review in PACS Verbal informed consent obtained.  Discussed risks and benefits of procedure. Warned about infection bleeding damage to structures skin hypopigmentation and fat atrophy among others. Patient expresses understanding and agreement Time-out conducted.   Noted no overlying erythema, induration, or other signs of local infection.   Skin prepped in a sterile fashion.   Local anesthesia: Topical Ethyl chloride.   With sterile technique and under real time ultrasound guidance:  Gelsyn 16.8 mg injected into knee joint. Fluid seen entering the joint capsule.   Completed without difficulty     Advised to call if fevers/chills, erythema, induration,  drainage, or persistent bleeding.   Images permanently stored and available for review in the ultrasound unit.  Impression: Technically successful ultrasound guided injection.     Lot #[id number] for both injections

## 2021-02-27 ENCOUNTER — Encounter: Payer: Self-pay | Admitting: Family Medicine

## 2021-02-27 ENCOUNTER — Telehealth: Payer: Self-pay

## 2021-02-27 NOTE — Telephone Encounter (Signed)
I would recommend waiting until she sees Dr Georgina Snell again if she is able to.  Algis Greenhouse. Jerline Pain, MD 02/27/2021 1:10 PM

## 2021-02-27 NOTE — Telephone Encounter (Signed)
Gave message below, patient verbalized understanding.

## 2021-02-27 NOTE — Telephone Encounter (Signed)
Please see message and advise 

## 2021-02-27 NOTE — Telephone Encounter (Signed)
Left message on voicemail to call office.  

## 2021-02-27 NOTE — Telephone Encounter (Signed)
Patient is calling in stating that she is having knee pain, was referred to [facility] for the issue and had injections with no relief. Has a follow up appointment with them on Thursday but is wanting to know if Dr.Parker will prescribe prednisone again for her.

## 2021-03-01 ENCOUNTER — Ambulatory Visit (INDEPENDENT_AMBULATORY_CARE_PROVIDER_SITE_OTHER): Payer: 59 | Admitting: Family Medicine

## 2021-03-01 ENCOUNTER — Other Ambulatory Visit: Payer: Self-pay

## 2021-03-01 ENCOUNTER — Encounter: Payer: Self-pay | Admitting: Family Medicine

## 2021-03-01 ENCOUNTER — Ambulatory Visit: Payer: Self-pay

## 2021-03-01 VITALS — BP 126/80 | HR 86 | Ht 66.5 in | Wt 318.4 lb

## 2021-03-01 DIAGNOSIS — M25561 Pain in right knee: Secondary | ICD-10-CM

## 2021-03-01 DIAGNOSIS — G8929 Other chronic pain: Secondary | ICD-10-CM

## 2021-03-01 DIAGNOSIS — M25562 Pain in left knee: Secondary | ICD-10-CM

## 2021-03-01 NOTE — Progress Notes (Signed)
I, Linda [name], LAT, ATC, am serving as scribe for Dr. Lynne [name].  Linda [name] is a 40 y.o. female who presents to [place] at [facility] today for f/u of B knee pain.  She was last seen by Dr. Georgina [name] on 01/08/21 and her last round of B Gelsyn knee injections.  Since her last visit, pt reports that her B knee pain is worse than previously and she feels like her R and L knee pain is now the same.  She denies any swelling in her knees but feels like her leges, in general, are more swollen.  She has been wearing her compression stockings.  She con't to take Meloxicam and Tylenol for pain.  Diagnostic testing: L knee MRI- 11/11/20; R knee XR- 10/23/20; L knee XR- 09/29/20   Pertinent review of systems: No fevers or chills  Relevant historical information: Morbid obesity   Exam:  BP 126/80 (BP Location: Left Arm, Patient Position: Sitting, Cuff Size: Large)   Pulse 86   Ht 5' 6.5" (1.689 m)   Wt (!) 318 lb 6.4 oz (144.4 kg)   SpO2 97%   BMI 50.62 kg/m  General: Well Developed, well nourished, and in no acute distress.   MSK: Right knee minimal effusion normal motion tender palpation medial and lateral joint line  Left knee minimal effusion normal motion tender palpation medial lateral joint line.  Crepitation with extension.   Lab and Radiology Results  Procedure: Real-time Ultrasound Guided Injection of right knee superior lateral patellar space Device: Philips Affiniti 50G Images permanently stored and available for review in PACS Verbal informed consent obtained.  Discussed risks and benefits of procedure. Warned about infection bleeding damage to structures skin hypopigmentation and fat atrophy among others. Patient expresses understanding and agreement Time-out conducted.   Noted no overlying erythema, induration, or other signs of local infection.   Skin prepped in a sterile fashion.   Local anesthesia: Topical Ethyl chloride.   With sterile  technique and under real time ultrasound guidance: 40 mg of Kenalog and 2 mL of Marcaine injected into knee joint. Fluid seen entering the joint capsule.   Completed without difficulty   Pain immediately resolved suggesting accurate placement of the medication.   Advised to call if fevers/chills, erythema, induration, drainage, or persistent bleeding.   Images permanently stored and available for review in the ultrasound unit.  Impression: Technically successful ultrasound guided injection.    Procedure: Real-time Ultrasound Guided Injection of left knee superior lateral patellar space Device: Philips Affiniti 50G Images permanently stored and available for review in PACS Verbal informed consent obtained.  Discussed risks and benefits of procedure. Warned about infection bleeding damage to structures skin hypopigmentation and fat atrophy among others. Patient expresses understanding and agreement Time-out conducted.   Noted no overlying erythema, induration, or other signs of local infection.   Skin prepped in a sterile fashion.   Local anesthesia: Topical Ethyl chloride.   With sterile technique and under real time ultrasound guidance: 40 mg of Kenalog and 2 mL of Marcaine injected into knee joint. Fluid seen entering the joint capsule.   Completed without difficulty   Pain immediately resolved suggesting accurate placement of the medication.   Advised to call if fevers/chills, erythema, induration, drainage, or persistent bleeding.   Images permanently stored and available for review in the ultrasound unit.  Impression: Technically successful ultrasound guided injection.      EXAM: MRI OF THE LEFT KNEE WITHOUT CONTRAST   TECHNIQUE: Multiplanar,  multisequence MR imaging of the knee was performed. No intravenous contrast was administered.   COMPARISON:  None.   FINDINGS: MENISCI   Medial: Small radial tear of the free edge of the posterior horn of the medial meniscus.    Lateral: Intact.   LIGAMENTS   Cruciates: ACL and PCL are intact.   Collaterals: Medial collateral ligament is intact. Lateral collateral ligament complex is intact.   CARTILAGE   Patellofemoral: Partial-thickness cartilage loss of the lateral patellar facet and patellar apex with subchondral reactive marrow edema.   Medial: Mild partial-thickness cartilage loss of the medial femorotibial compartment.   Lateral:  No chondral defect.   JOINT: Small joint effusion. Mild edema in superolateral Hoffa's. No plical thickening.   POPLITEAL FOSSA: Popliteus tendon is intact. Tiny Baker's cyst.   EXTENSOR MECHANISM: Intact quadriceps tendon. Intact patellar tendon. Intact lateral patellar retinaculum. Intact medial patellar retinaculum. Intact MPFL.   BONES: No aggressive osseous lesion. No fracture or dislocation.   Other: No fluid collection or hematoma. Muscles are normal.   IMPRESSION: 1. Small radial tear of the free edge of the posterior horn of the medial meniscus. 2. Partial-thickness cartilage loss of the lateral patellar facet and patellar apex with subchondral reactive marrow edema. 3. Mild partial-thickness cartilage loss of the medial femorotibial compartment.     Electronically Signed   By: Linda [name]   On: 11/12/2020 09:11 I, Linda [name], personally (independently) visualized and performed the interpretation of the images attached in this note.      Assessment and Plan: 40 y.o. female with bilateral knee pain left worse than right.  Unfortunately Linda [name] is failing conservative management especially for her left knee.  She had an MRI in March which showed a small meniscus tear but primarily patellar degenerative changes that I think are the primary cause of her knee pain.  She did have steroid injection which helped but did not provide great lasting benefit.  She subsequently had a trial of hyaluronic acid injections in May which did not help much at  all.  We discussed treatment options.  I do think a surgical consultation to have discussed options with her left knee is reasonable although I do not expect there is going to be a great surgical options at this time.  It is worth at least a discussion.  I do think the most likely thing that can be done for her knee pain is significant weight loss.  Her BMI is currently 50.62.  She would need to lose a substantial amount of weight and based on expectations aperiodic surgery is probably her best bet.  After discussion she is willing to consider this.  We will plan to refer to general surgery for bariatric consultation.  Discussed bariatric surgery options including gastric sleeve and Roux-en-Y bypass.   PDMP not reviewed this encounter. Orders Placed This Encounter  Procedures   [place] LIMITED JOINT SPACE STRUCTURES LOW BILAT(NO LINKED CHARGES)    Order Specific Question:   Reason for Exam (SYMPTOM  OR DIAGNOSIS REQUIRED)    Answer:   B knee pain    Order Specific Question:   Preferred imaging location?    Answer:   [facility]   Ambulatory referral to General Surgery    Referral Priority:   Routine    Referral Type:   Surgical    Referral Reason:   Specialty Services Required    Requested Specialty:   General Surgery    Number of Visits Requested:  1   Ambulatory referral to Orthopedic Surgery    Referral Priority:   Routine    Referral Type:   Surgical    Referral Reason:   Specialty Services Required    Requested Specialty:   Orthopedic Surgery    Number of Visits Requested:   1   No orders of the defined types were placed in this encounter.    Discussed warning signs or symptoms. Please see discharge instructions. Patient expresses understanding.   The above documentation has been reviewed and is accurate and complete Linda [name], M.D.

## 2021-03-01 NOTE — Patient Instructions (Signed)
Thank you for coming in today.   You should hear from both [facility] [place] [facility] about possible bariatric surgery and from  [place] about about knee surgery options soon.   Let me know if you do not hear anything,   Bariatric Surgery Information Bariatric surgery, also called weight loss surgery, is a procedure that helps you lose weight. You may consider, or your health care provider may suggest, bariatric surgery if: You are severely obese and have been unable to lose weight through diet and exercise. You have health problems related to obesity, such as: Type 2 diabetes. Heart disease. Lung disease. How does bariatric surgery help me lose weight? Bariatric surgery helps you lose weight by: Decreasing how much food your body absorbs. This is done by closing off part of your stomach to make it smaller. This restricts the amount of food your stomach can hold. Changing your body's regular digestive process so that food bypasses the parts of your body that absorb calories and nutrients. If you decide to have bariatric surgery, it is important to continue to eat ahealthy diet and exercise regularly after the surgery. What are the different kinds of bariatric surgery? There are two kinds of bariatric surgeries: Restrictive surgery. This procedure makes your stomach smaller. It does not change your digestive process. The smaller the size of your new stomach, the less food you can eat. There are different types of restrictive surgeries. Malabsorptive surgery. This procedure makes your stomach smaller and alters your digestive process so that your body processes less calories and nutrients. These are the most common kind of bariatric surgery. There are different types of malabsorptive surgeries. What are the different types of restrictive surgery? Adjustable Gastric Banding In this procedure, an inflatable band is placed around your stomach near the upper end. This makes the passageway  for food into the rest of your stomach much smaller. The band can be adjusted, making it tighter or looser, by filling it with salt solution. Your surgeon can adjust the band based on how you are feeling and how much weight you are losing. The band can be removed in thefuture. This requires another surgery. Sleeve Gastrectomy In this procedure, your stomach is made smaller. This is done by surgically removing a large part of your stomach. When your stomach is smaller, you feelfull more quickly and reduce how much you eat. What are the different types of malabsorptive surgery?  Roux-en-Y Gastric Bypass (RGB) This is the most common weight loss surgery. In this procedure, a small stomach pouch (gastric pouch) is created in the upper part of your stomach. Next, this gastric pouch is attached directly to the middle part of your small intestine. The farther down your small intestine the new connection is made, the fewer calories andnutrients you will absorb. This surgery has the highest rate of complications. Biliopancreatic Diversion with Duodenal Switch (BPD/DS) This is a multi-step procedure. First, a large part of your stomach is removed, making your stomach smaller. Next, this smaller stomach is attached to the lower part of your small intestine. Like the RGB surgery, you absorb fewer calories and nutrients the farther down your small intestine the attachment ismade. What are the risks of bariatric surgery? As with any surgical procedure, each type of bariatric surgery has its own risks. These risks also depend on your age, your overall health, and any other medical conditions you may have. When deciding on bariatric surgery, it is very important to: Talk to your health care provider  and choose the surgery that is best for you. Ask your health care provider about specific risks for the surgery you choose. Generally, the risks of bariatric surgery include: Infection. Bleeding. Not getting enough  nutrients from food (nutritional deficiencies). Failure of the device or procedure. This may require another surgery to correct the problem. Where to find more information American Society for Metabolic & Bariatric Surgery: www.asmbs.org Weight-control Information Network (WIN): win.AmenCredit.is Summary Bariatric surgery, also called weight loss surgery, is a procedure that helps you lose weight. This surgery may be recommended if you have diabetes, heart disease, or lung disease. Generally, risks of bariatric surgery include infection, bleeding, and failure of the surgery or device, which may require another surgery to correct the problem. This information is not intended to replace advice given to you by your health care provider. Make sure you discuss any questions you have with your healthcare provider. Document Revised: 12/16/2019 Document Reviewed: 12/16/2019 Elsevier Patient Education  2022 [employer].

## 2021-03-19 ENCOUNTER — Other Ambulatory Visit (HOSPITAL_COMMUNITY): Payer: Self-pay

## 2021-03-19 ENCOUNTER — Other Ambulatory Visit: Payer: Self-pay

## 2021-03-19 ENCOUNTER — Other Ambulatory Visit (HOSPITAL_BASED_OUTPATIENT_CLINIC_OR_DEPARTMENT_OTHER): Payer: Self-pay

## 2021-03-19 ENCOUNTER — Ambulatory Visit (INDEPENDENT_AMBULATORY_CARE_PROVIDER_SITE_OTHER): Payer: 59 | Admitting: Orthopedic Surgery

## 2021-03-19 DIAGNOSIS — M2242 Chondromalacia patellae, left knee: Secondary | ICD-10-CM

## 2021-03-19 MED ORDER — CELECOXIB 100 MG PO CAPS
ORAL_CAPSULE | ORAL | 0 refills | Status: DC
  Filled 2021-03-19: qty 60, 60d supply, fill #0
  Filled 2021-03-19: qty 60, 53d supply, fill #0

## 2021-03-21 ENCOUNTER — Other Ambulatory Visit (HOSPITAL_COMMUNITY): Payer: Self-pay

## 2021-03-21 ENCOUNTER — Encounter: Payer: Self-pay | Admitting: Orthopedic Surgery

## 2021-03-22 NOTE — Progress Notes (Signed)
Office Visit Note   [name]           Date of Birth: June 08, 1981           MRN: [id number] Visit Date: 03/19/2021 Requested by: Gregor Hams, MD [place],  [place] [address] PCP: Vivi Barrack, MD  Subjective: Chief Complaint  Patient presents with   Left Knee - Pain    HPI: Linda [name] is a 40 year old patient with left knee pain.  She has had chronic pain for about a year.  No recent injury.  She reports weakness locking popping and pain that wakes her from sleep at night.  Mobic has not been helpful.  She has reflux and she takes Pepcid.  She is also tried ibuprofen.  She is also had gel and cortisone in the left knee without too much sustained relief.  From the gel but the cortisone does help her some.  She feels like she is "carrying a log on her knee".  Stairs hurt more than flat ground walking.  She works as a Marine scientist at the [employer].  She is on her feet a lot.  Last cortisone injection was 03/01/2021.  MRI scan is reviewed and it shows a very small radial tear in the free edge of the posterior horn of the medial meniscus.  I examined the scan with the patient.  Not too much bony edema around the medial tibial plateau in the region of the meniscal tear.  No unstable flaps are visible.  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  Here there is a little bit of bony edema and changes consistent with more significant chondrosis particularly on the lateral facet of the patella.              ROS: All systems reviewed are negative as they relate to the chief complaint within the history of present illness.  Patient denies  fevers or chills.   Assessment & Plan: Visit Diagnoses: No diagnosis found.  Plan: Impression is left knee pain with 2 potential sources.  Patient does have a small medial meniscal tear but no definitive mechanical symptoms.  I think most of her pain is coming from her patellofemoral joint.  She  does have some chondral wear on the lateral facet.  From a surgical perspective debridement and possible microfracture and bio cartilage could be placed.  That may or may not help her symptoms.  The meniscal tear could also be addressed but I doubt that that a big pain generator at this time.  The loss of cushioning function from the radial tear could accelerate medial compartment arthritis development in this patient who has an increased body mass index.  She is going to consider her surgical options.  We will try her on Celebrex.  I think there is a chance that arthroscopic intervention but the pathology is not easily treatable and the stress on the knee is fairly significant at this time.  Follow-Up Instructions: Return if symptoms worsen or fail to improve.   Orders:  No orders of the defined types were placed in this encounter.  Meds ordered this encounter  Medications   celecoxib (CELEBREX) 100 MG capsule    Sig: Take 1 capsule by mouth twice daily for 7 days then take 1 capsule daily as needed    Dispense:  60 capsule    Refill:  0      Procedures: No procedures performed   Clinical Data: No  additional findings.  Objective: Vital Signs: There were no vitals taken for this visit.  Physical Exam:   Constitutional: Patient appears well-developed HEENT:  Head: Normocephalic Eyes:EOM are normal Neck: Normal range of motion Cardiovascular: Normal rate Pulmonary/chest: Effort normal Neurologic: Patient is alert Skin: Skin is warm Psychiatric: Patient has normal mood and affect   Ortho Exam: Ortho exam demonstrates normal gait alignment.  Trace effusion in that left knee.  Has more patellofemoral crepitus on the left than the right.  Collateral crucial ligaments are stable.  Mild but underwhelming medial joint line tenderness is present on the left-hand side with equivocal McMurray compression testing collateral crucial ligaments are stable.  No masses lymphadenopathy or skin  changes noted in that left knee region.  No calf tenderness negative Homans.  Range of motion is full.  Specialty Comments:  No specialty comments available.  Imaging: No results found.   PMFS History: Patient Active Problem List   Diagnosis Date Noted   Morbid obesity ([place]) 11/19/2019   Polyarthralgia 11/19/2019   Elevated glucose 11/19/2019   Left knee pain 02/08/2019   Essential hypertension 02/08/2019   Gastroesophageal reflux disease 02/08/2019   Frequent headaches 02/08/2019   Iron deficiency anemia 03/27/2017   Past Medical History:  Diagnosis Date   Anemia    Frequent headaches    GERD (gastroesophageal reflux disease)    Hypertension     Family History  Problem Relation Age of Onset   Hypertension Mother    Hypertension Father     Past Surgical History:  Procedure Laterality Date   CESAREAN SECTION     Social History   Occupational History   Not on file  Tobacco Use   Smoking status: Never   Smokeless tobacco: Never  Vaping Use   Vaping Use: Never used  Substance and Sexual Activity   Alcohol use: Yes    Comment: occ   Drug use: Never   Sexual activity: Yes    Partners: Male    Birth control/protection: Condom

## 2021-05-01 ENCOUNTER — Encounter: Payer: Self-pay | Admitting: Family Medicine

## 2021-05-28 ENCOUNTER — Other Ambulatory Visit: Payer: Self-pay | Admitting: Orthopedic Surgery

## 2021-05-28 DIAGNOSIS — M2242 Chondromalacia patellae, left knee: Secondary | ICD-10-CM

## 2021-05-29 ENCOUNTER — Encounter: Payer: Self-pay | Admitting: Orthopedic Surgery

## 2021-05-29 ENCOUNTER — Other Ambulatory Visit (HOSPITAL_BASED_OUTPATIENT_CLINIC_OR_DEPARTMENT_OTHER): Payer: Self-pay

## 2021-05-29 NOTE — Telephone Encounter (Signed)
Ok to rf pls calal thx

## 2021-05-30 ENCOUNTER — Other Ambulatory Visit (HOSPITAL_BASED_OUTPATIENT_CLINIC_OR_DEPARTMENT_OTHER): Payer: Self-pay

## 2021-05-30 ENCOUNTER — Other Ambulatory Visit: Payer: Self-pay | Admitting: Orthopedic Surgery

## 2021-05-30 MED ORDER — CELECOXIB 100 MG PO CAPS
ORAL_CAPSULE | ORAL | 0 refills | Status: DC
  Filled 2021-05-30: qty 60, 53d supply, fill #0

## 2021-05-30 NOTE — Telephone Encounter (Signed)
Refilled, dont take with mobic

## 2021-05-31 NOTE — Progress Notes (Signed)
I, Wendy Poet, LAT, ATC, am serving as scribe for Dr. Lynne Leader.  Linda [name] is a 40 y.o. female who presents to [facility] today for f/u of chronic B knee pain.  She was last seen by Dr. Georgina Snell on 03/01/21 and had B knee steroid injections.  Prior to that, she was last seen by Dr. Georgina Snell on 01/08/21 and had her last round of B Gelsyn knee injections.  She has been wearing compression stockings and taking Meloxicam and Tylenol.  She was also referred to both orthopedics and bariatric surgery.  She saw Dr. Marlou Sa on 03/19/21.  Today, pt reports she only got about a month of relief from the steroid injection. Pt reports difficulty walking and can hardly transition from sitting to standing. Pt has been taking Celebrex w/ Tylenol and had some relief.  Diagnostic testing: L knee MRI- 11/11/20; R knee XR- 10/23/20; L knee XR- 09/29/20  Pertinent review of systems: No fevers or chills  Relevant historical information: Morbid obesity   Exam:  BP 124/84   Pulse 79   Ht 5' 6.5" (1.689 m)   Wt (!) 315 lb 3.2 oz (143 kg)   SpO2 97%   BMI 50.11 kg/m  General: Well Developed, well nourished, and in no acute distress.   MSK: Right knee no joint effusion normal motion with crepitation.  Left knee no joint effusion.  Normal motion with crepitation.    Lab and Radiology Results  Procedure: Real-time Ultrasound Guided Injection of right knee superior lateral patellar space Device: Philips Affiniti 50G Images permanently stored and available for review in PACS Verbal informed consent obtained.  Discussed risks and benefits of procedure. Warned about infection bleeding damage to structures skin hypopigmentation and fat atrophy among others. Patient expresses understanding and agreement Time-out conducted.   Noted no overlying erythema, induration, or other signs of local infection.   Skin prepped in a sterile fashion.   Local anesthesia: Topical Ethyl  chloride.   With sterile technique and under real time ultrasound guidance: 40 mg of Kenalog and 2 mL of Marcaine injected into knee joint. Fluid seen entering the joint capsule.   Completed without difficulty   Pain immediately resolved suggesting accurate placement of the medication.   Advised to call if fevers/chills, erythema, induration, drainage, or persistent bleeding.   Images permanently stored and available for review in the ultrasound unit.  Impression: Technically successful ultrasound guided injection.    Procedure: Real-time Ultrasound Guided Injection of left knee superior lateral patellar space Device: Philips Affiniti 50G Images permanently stored and available for review in PACS Verbal informed consent obtained.  Discussed risks and benefits of procedure. Warned about infection bleeding damage to structures skin hypopigmentation and fat atrophy among others. Patient expresses understanding and agreement Time-out conducted.   Noted no overlying erythema, induration, or other signs of local infection.   Skin prepped in a sterile fashion.   Local anesthesia: Topical Ethyl chloride.   With sterile technique and under real time ultrasound guidance: 40 mg of Kenalog and 2 mL of Marcaine injected into knee joint. Fluid seen entering the joint capsule.   Completed without difficulty   Pain immediately resolved suggesting accurate placement of the medication.   Advised to call if fevers/chills, erythema, induration, drainage, or persistent bleeding.   Images permanently stored and available for review in the ultrasound unit.  Impression: Technically successful ultrasound guided injection.      Assessment and Plan: 40 y.o. 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  Plan to proceed with bilateral steroid injection today.  Patient had trial of hyaluronic acid injection in the past with little benefit.  We will go ahead and  work on authorization now for [employer] which is extended release steroid which often lasts a lot longer as she only had 1 month of benefit from prior steroid injection. Can do this as soon as this injection series wears off.  Additionally spent time talking about obesity.  Was previously referred for bariatric surgery.  Recommend proceed with this as significant weight loss will be essential for managing knee pain going forward.   PDMP not reviewed this encounter. Orders Placed This Encounter  Procedures   [place] LIMITED JOINT SPACE STRUCTURES LOW BILAT(NO LINKED CHARGES)    Order Specific Question:   Reason for Exam (SYMPTOM  OR DIAGNOSIS REQUIRED)    Answer:   B knee pain    Order Specific Question:   Preferred imaging location?    Answer:   [facility]   No orders of the defined types were placed in this encounter.    Discussed warning signs or symptoms. Please see discharge instructions. Patient expresses understanding.   The above documentation has been reviewed and is accurate and complete Lynne Leader, M.D.

## 2021-06-01 ENCOUNTER — Ambulatory Visit (INDEPENDENT_AMBULATORY_CARE_PROVIDER_SITE_OTHER): Payer: 59 | Admitting: Family Medicine

## 2021-06-01 ENCOUNTER — Ambulatory Visit: Payer: Self-pay

## 2021-06-01 ENCOUNTER — Other Ambulatory Visit: Payer: Self-pay

## 2021-06-01 VITALS — BP 124/84 | HR 79 | Ht 66.5 in | Wt 315.2 lb

## 2021-06-01 DIAGNOSIS — G8929 Other chronic pain: Secondary | ICD-10-CM

## 2021-06-01 DIAGNOSIS — M25561 Pain in right knee: Secondary | ICD-10-CM

## 2021-06-01 DIAGNOSIS — M25562 Pain in left knee: Secondary | ICD-10-CM | POA: Diagnosis not present

## 2021-06-01 NOTE — Patient Instructions (Addendum)
Thank you for coming in today.   You received an injection today. Seek immediate medical attention if the joint becomes red, extremely painful, or is oozing fluid.   We will work to get [place] approved through [employer] and will reach out to let you know what the outcome is regarding authorization.  Please pursue bariatric surgery.

## 2021-06-12 ENCOUNTER — Other Ambulatory Visit (HOSPITAL_BASED_OUTPATIENT_CLINIC_OR_DEPARTMENT_OTHER): Payer: Self-pay

## 2021-06-12 ENCOUNTER — Other Ambulatory Visit: Payer: Self-pay | Admitting: Family Medicine

## 2021-06-12 DIAGNOSIS — I10 Essential (primary) hypertension: Secondary | ICD-10-CM

## 2021-06-12 MED ORDER — AMLODIPINE BESYLATE 10 MG PO TABS
ORAL_TABLET | Freq: Every day | ORAL | 0 refills | Status: DC
  Filled 2021-06-12: qty 90, 90d supply, fill #0

## 2021-07-06 ENCOUNTER — Encounter: Payer: Self-pay | Admitting: Family Medicine

## 2021-07-06 ENCOUNTER — Other Ambulatory Visit: Payer: Self-pay

## 2021-07-06 ENCOUNTER — Ambulatory Visit: Payer: 59 | Admitting: Family Medicine

## 2021-07-06 VITALS — BP 132/87 | HR 73 | Temp 97.8°F | Ht 66.5 in | Wt 317.0 lb

## 2021-07-06 DIAGNOSIS — N926 Irregular menstruation, unspecified: Secondary | ICD-10-CM | POA: Diagnosis not present

## 2021-07-06 DIAGNOSIS — N939 Abnormal uterine and vaginal bleeding, unspecified: Secondary | ICD-10-CM

## 2021-07-06 DIAGNOSIS — I10 Essential (primary) hypertension: Secondary | ICD-10-CM

## 2021-07-06 LAB — COMPREHENSIVE METABOLIC PANEL
ALT: 14 U/L (ref 0–35)
AST: 9 U/L (ref 0–37)
Albumin: 3.6 g/dL (ref 3.5–5.2)
Alkaline Phosphatase: 69 U/L (ref 39–117)
BUN: 9 mg/dL (ref 6–23)
CO2: 31 mEq/L (ref 19–32)
Calcium: 9 mg/dL (ref 8.4–10.5)
Chloride: 102 mEq/L (ref 96–112)
Creatinine, Ser: 0.72 mg/dL (ref 0.40–1.20)
GFR: 105.72 mL/min (ref >60.00)
Glucose, Bld: 109 mg/dL — ABNORMAL HIGH (ref 70–99)
Potassium: 3.4 mEq/L — ABNORMAL LOW (ref 3.5–5.1)
Sodium: 140 mEq/L (ref 135–145)
Total Bilirubin: 0.2 mg/dL (ref 0.2–1.2)
Total Protein: 7.1 g/dL (ref 6.0–8.3)

## 2021-07-06 LAB — CBC
HCT: 37.1 % (ref 36.0–46.0)
Hemoglobin: 11.8 g/dL — ABNORMAL LOW (ref 12.0–15.0)
MCHC: 31.7 g/dL (ref 30.0–36.0)
MCV: 75.6 fl — ABNORMAL LOW (ref 78.0–100.0)
Platelets: 338 10*3/uL (ref 150.0–400.0)
RBC: 4.91 Mil/uL (ref 3.87–5.11)
RDW: 16.2 % — ABNORMAL HIGH (ref 11.5–15.5)
WBC: 7.6 10*3/uL (ref 4.0–10.5)

## 2021-07-06 LAB — FOLLICLE STIMULATING HORMONE: FSH: 2.5 m[IU]/mL

## 2021-07-06 LAB — POCT URINE PREGNANCY: Preg Test, Ur: NEGATIVE

## 2021-07-06 LAB — TSH: TSH: 1.64 u[IU]/mL (ref 0.35–5.50)

## 2021-07-06 NOTE — Assessment & Plan Note (Signed)
At goal on amlodipine 10 mg daily. 

## 2021-07-06 NOTE — Assessment & Plan Note (Signed)
No red flags.  Vital signs are stable.  Likely ovulatory bleeding secondary to obesity however we will check labs and ultrasound today to rule out other causes.  Can consider starting hormonal contraceptive depending on results of work-up.

## 2021-07-06 NOTE — Patient Instructions (Signed)
It was very nice to see you today!  We will refer you to see the weight management clinic.  We will check blood work, pregnancy test, and an ultrasound today.  Depending on the results we may start a birth control pill.  Take care, Dr Jerline Pain  PLEASE NOTE:  If you had any lab tests please let us know if you have not heard back within a few days. You may see your results on mychart before we have a chance to review them but we will give you a call once they are reviewed by [place]. If we ordered any referrals today, please let us know if you have not heard from their office within the next week.   Please try these tips to maintain a healthy lifestyle:  Eat at least 3 REAL meals and 1-2 snacks per day.  Aim for no more than 5 hours between eating.  If you eat breakfast, please do so within one hour of getting up.   Each meal should contain half fruits/vegetables, one quarter protein, and one quarter carbs (no bigger than a computer mouse)  Cut down on sweet beverages. This includes juice, soda, and sweet tea.   Drink at least 1 glass of water with each meal and aim for at least 8 glasses per day  Exercise at least 150 minutes every week.

## 2021-07-06 NOTE — Progress Notes (Signed)
   Linda [name] is a 40 y.o. female who presents today for an office visit.  Assessment/Plan:  Chronic Problems Addressed Today: Morbid obesity ([employer]) Will place referral to medical weight management.  Her excess weight is likely contributing to her dysfunctional uterine bleeding as well as her arthritis pain.  Essential hypertension At goal on amlodipine 10 mg daily.  Abnormal uterine bleeding No red flags.  Vital signs are stable.  Likely ovulatory bleeding secondary to obesity however we will check labs and ultrasound today to rule out other causes.  Can consider starting hormonal contraceptive depending on results of work-up.     Subjective:  HPI:  Patient here with abnormal uterine bleeding.  She has been off cycle for the last few weeks.  Her weight last for 3 to 5 days.  Usually regular.  She is passing clots.  No lightheadedness.  No dizziness.  No chest pain or shortness of breath.  She has never had pain like this before.  No specific treatments tried.  She has not been sexually active in a couple of months.  She has had mild associated cramping.  No severe pain.  No fevers or chills.  No vaginal discharge.       Objective:  Physical Exam: BP 132/87   Pulse 73   Temp 97.8 F (36.6 C) (Temporal)   Ht 5' 6.5" (1.689 m)   Wt (!) 317 lb (143.8 kg)   SpO2 100%   BMI 50.40 kg/m   Gen: No acute distress, resting comfortably CV: Regular rate and rhythm with no murmurs appreciated Pulm: Normal work of breathing, clear to auscultation bilaterally with no crackles, wheezes, or rhonchi Neuro: Grossly normal, moves all extremities Psych: Normal affect and thought content      Linda [name] M. Jerline Pain, MD 07/06/2021 11:21 AM

## 2021-07-06 NOTE — Assessment & Plan Note (Signed)
Will place referral to medical weight management.  Her excess weight is likely contributing to her dysfunctional uterine bleeding as well as her arthritis pain.

## 2021-07-11 ENCOUNTER — Other Ambulatory Visit: Payer: Self-pay | Admitting: Family Medicine

## 2021-07-11 ENCOUNTER — Encounter: Payer: Self-pay | Admitting: Family Medicine

## 2021-07-11 DIAGNOSIS — N939 Abnormal uterine and vaginal bleeding, unspecified: Secondary | ICD-10-CM

## 2021-07-11 NOTE — Progress Notes (Signed)
l °

## 2021-07-12 NOTE — Progress Notes (Signed)
Please inform patient of the following:  Please see mychart message. Everything so far normal but still waiting on estrogen level.

## 2021-07-13 LAB — PROLACTIN: Prolactin: 23.5 ng/mL

## 2021-07-13 LAB — ESTROGENS, TOTAL: Estrogen: 443.2 pg/mL

## 2021-07-16 NOTE — Progress Notes (Signed)
Please inform patient of the following:  Her estrogen levels are normal. Can we make sure her ultrasound has been scheduled? We will contact her with results once we have them.  Algis Greenhouse. Jerline Pain, MD 07/16/2021 8:02 AM

## 2021-07-26 ENCOUNTER — Other Ambulatory Visit: Payer: Self-pay

## 2021-07-26 ENCOUNTER — Ambulatory Visit (INDEPENDENT_AMBULATORY_CARE_PROVIDER_SITE_OTHER)
Admission: RE | Admit: 2021-07-26 | Discharge: 2021-07-26 | Disposition: A | Discharge status: Home / Self Care | Payer: 59 | Source: Ambulatory Visit | Attending: Family Medicine | Admitting: Family Medicine

## 2021-07-26 ENCOUNTER — Encounter: Payer: Self-pay | Admitting: Family Medicine

## 2021-07-26 ENCOUNTER — Ambulatory Visit: Payer: 59 | Admitting: Family Medicine

## 2021-07-26 VITALS — BP 133/66 | HR 69 | Temp 98.1°F | Ht 66.5 in | Wt 317.0 lb

## 2021-07-26 DIAGNOSIS — I10 Essential (primary) hypertension: Secondary | ICD-10-CM

## 2021-07-26 DIAGNOSIS — R7611 Nonspecific reaction to tuberculin skin test without active tuberculosis: Secondary | ICD-10-CM | POA: Diagnosis not present

## 2021-07-26 DIAGNOSIS — N939 Abnormal uterine and vaginal bleeding, unspecified: Secondary | ICD-10-CM | POA: Diagnosis not present

## 2021-07-26 NOTE — Assessment & Plan Note (Signed)
She has already completed treatment for latent tuberculosis.  We will check chest x-ray per request of patient and to complete form for school.

## 2021-07-26 NOTE — Assessment & Plan Note (Signed)
At goal on amlodipine 10 mg daily. 

## 2021-07-26 NOTE — Assessment & Plan Note (Signed)
No significant change since last visit.  Blood work was normal however ultrasound is pending.  She has a scheduled for next week.

## 2021-07-26 NOTE — Progress Notes (Signed)
   Phoebe Marter is a 40 y.o. female who presents today for an office visit.  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  We will check chest x-ray per request of patient and to complete form for school.  Abnormal uterine bleeding No significant change since last visit.  Blood work was normal however ultrasound is pending.  She has a scheduled for next week.  Essential hypertension At goal on amlodipine 10 mg daily.     Subjective:  HPI:  Patient here needing to get chest x-ray done for school.  She had positive PPD a few years ago.  She completed treatment for latent TB.  She is currently going to school for her masters program.  They request that she needs to have a chest x-ray done.  Please see A/P for status of other chronic conditions.  Still has ongoing issues with abnormal uterine bleeding.       Objective:  Physical Exam: BP 133/66   Pulse 69   Temp 98.1 F (36.7 C) (Temporal)   Ht 5' 6.5" (1.689 m)   Wt (!) 317 lb (143.8 kg)   SpO2 99%   BMI 50.40 kg/m   Gen: No acute distress, resting comfortably CV: Regular rate and rhythm with no murmurs appreciated Pulm: Normal work of breathing, clear to auscultation bilaterally with no crackles, wheezes, or rhonchi Neuro: Grossly normal, moves all extremities Psych: Normal affect and thought content       I,Savera Zaman,acting as a scribe for Dimas Chyle, MD.,have documented all relevant documentation on the behalf of Dimas Chyle, MD,as directed by  Dimas Chyle, MD while in the presence of Dimas Chyle, MD.   I, Dimas Chyle, MD, have reviewed all documentation for this visit. The documentation on 07/26/21 for the exam, diagnosis, procedures, and orders are all accurate and complete.  Algis Greenhouse. Jerline Pain, MD 07/26/2021 3:47 PM

## 2021-07-27 ENCOUNTER — Encounter: Payer: Self-pay | Admitting: Family Medicine

## 2021-07-30 NOTE — Progress Notes (Signed)
Please inform patient of the following:  Her chest xray is clear. It is ok for [place] to complete her form for school.  Algis Greenhouse. Jerline Pain, MD 07/30/2021 12:58 PM

## 2021-07-31 ENCOUNTER — Telehealth: Payer: Self-pay

## 2021-07-31 NOTE — Telephone Encounter (Signed)
Patient has called back in regard to x ray results.   I have given her Dr. Ellwood Handler response.  I have spoke with Kela and have informed patient that form will hopefully be completed by EOD.  Informed patient, she will be called once form is completed.  I have also transferred patient over to Medical Records to obtain a copy of the x ray.

## 2021-08-01 ENCOUNTER — Ambulatory Visit
Admission: RE | Admit: 2021-08-01 | Discharge: 2021-08-01 | Disposition: A | Discharge status: Home / Self Care | Payer: 59 | Source: Ambulatory Visit | Attending: Family Medicine | Admitting: Family Medicine

## 2021-08-01 DIAGNOSIS — N939 Abnormal uterine and vaginal bleeding, unspecified: Secondary | ICD-10-CM

## 2021-08-01 NOTE — Telephone Encounter (Signed)
LVM for patient that form is ready for pickup in front office.   Witnessed by Rhett Bannister

## 2021-08-02 ENCOUNTER — Encounter: Payer: Self-pay | Admitting: Family Medicine

## 2021-08-02 NOTE — Progress Notes (Signed)
Please inform patient of the following:  Her ultrasound showed a couple of large ovarian cysts. This could explain some of her symptoms recommend she follow up with OBGYN. Please place referral if needed.  Algis Greenhouse. Jerline Pain, MD 08/02/2021 3:20 PM

## 2021-08-03 ENCOUNTER — Other Ambulatory Visit: Payer: Self-pay | Admitting: *Deleted

## 2021-08-03 DIAGNOSIS — N83209 Unspecified ovarian cyst, unspecified side: Secondary | ICD-10-CM

## 2021-08-03 NOTE — Telephone Encounter (Signed)
See results note. 

## 2021-08-31 ENCOUNTER — Telehealth: Payer: Self-pay

## 2021-08-31 NOTE — Telephone Encounter (Signed)
error 

## 2021-09-03 ENCOUNTER — Ambulatory Visit (INDEPENDENT_AMBULATORY_CARE_PROVIDER_SITE_OTHER): Payer: 59 | Admitting: Family Medicine

## 2021-09-03 ENCOUNTER — Encounter: Payer: Self-pay | Admitting: Family Medicine

## 2021-09-03 ENCOUNTER — Ambulatory Visit: Payer: Self-pay

## 2021-09-03 ENCOUNTER — Other Ambulatory Visit: Payer: Self-pay

## 2021-09-03 VITALS — BP 130/84 | HR 71 | Ht 66.5 in | Wt 321.0 lb

## 2021-09-03 DIAGNOSIS — G8929 Other chronic pain: Secondary | ICD-10-CM

## 2021-09-03 DIAGNOSIS — M25561 Pain in right knee: Secondary | ICD-10-CM | POA: Diagnosis not present

## 2021-09-03 DIAGNOSIS — M25562 Pain in left knee: Secondary | ICD-10-CM

## 2021-09-03 DIAGNOSIS — M170 Bilateral primary osteoarthritis of knee: Secondary | ICD-10-CM | POA: Diagnosis not present

## 2021-09-03 NOTE — Progress Notes (Signed)
I, Wendy Poet, LAT, ATC, am serving as scribe for Dr. Lynne Leader.  Linda [name] is a 41 y.o. female who presents to [place] at [facility] today for f/u of chronic B knee pain.  She was last seen by Dr. Georgina Snell on 06/01/21 and had B knee steroid injections.  Prior to that, she completed B Gelsyn series on 01/08/21. Today, pt reports that her knee pain has returned.  She states that the steroid injections help for approximately 2-2.5 months.  She is interested in pursuing Zilretta injections.   Dx imaging: 11/11/20 L knee MRI  10/23/20 R knee XR  09/29/20 L knee XR  Pertinent review of systems: no fever or chills  Relevant historical information: HTN   Exam:  BP 130/84 (BP Location: Right Arm, Patient Position: Sitting, Cuff Size: Large)    Pulse 71    Ht 5' 6.5" (1.689 m)    Wt (!) 321 lb (145.6 kg)    SpO2 97%    BMI 51.03 kg/m  General: Well Developed, well nourished, and in no acute distress.   MSK: Rt knee minimal effusion Normal motion.  Intact strength.   Lt knee. Minimal effusion.  Normal motion.  Intact strength.     Lab and Radiology Results Procedure: Real-time Ultrasound Guided Injection of right knee superior lateral patellar space  Device: Philips Affiniti 50G Images permanently stored and available for review in PACS Verbal informed consent obtained.  Discussed risks and benefits of procedure. Warned about infection bleeding damage to structures skin hypopigmentation and fat atrophy among others. Patient expresses understanding and agreement Time-out conducted.   Noted no overlying erythema, induration, or other signs of local infection.   Skin prepped in a sterile fashion.   Local anesthesia: Topical Ethyl chloride.   With sterile technique and under real time ultrasound guidance:  Zilretta 32mg  injected into knee joint. Fluid seen entering the joint capsule.   Completed without difficulty   Advised to call if fevers/chills,  erythema, induration, drainage, or persistent bleeding.   Images permanently stored and available for review in the ultrasound unit.  Impression: Technically successful ultrasound guided injection.   Procedure: Real-time Ultrasound Guided Injection of left knee superior lateral patella space  Device: Philips Affiniti 50G Images permanently stored and available for review in PACS Verbal informed consent obtained.  Discussed risks and benefits of procedure. Warned about infection bleeding damage to structures skin hypopigmentation and fat atrophy among others. Patient expresses understanding and agreement Time-out conducted.   Noted no overlying erythema, induration, or other signs of local infection.   Skin prepped in a sterile fashion.   Local anesthesia: Topical Ethyl chloride.   With sterile technique and under real time ultrasound guidance:  Zilretta 32mg  injected into knee joint. Fluid seen entering the joint capsule.   Completed without difficulty   Advised to call if fevers/chills, erythema, induration, drainage, or persistent bleeding.   Images permanently stored and available for review in the ultrasound unit.  Impression: Technically successful ultrasound guided injection.     Assessment and Plan: 41 y.o. 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  Trying zilretta injections today.   Also spent time discussion weight loss.   Recheck PRN.    PDMP not reviewed this encounter. Orders Placed This Encounter  Procedures   [place] LIMITED JOINT SPACE STRUCTURES LOW BILAT(NO LINKED CHARGES)    Order Specific Question:   Reason for Exam (SYMPTOM  OR DIAGNOSIS REQUIRED)  Answer:   B knee pain    Order Specific Question:   Preferred imaging location?    Answer:   [place]   No orders of the defined types were placed in this encounter.    Discussed warning signs or symptoms. Please see discharge instructions.  Patient expresses understanding.   The above documentation has been reviewed and is accurate and complete Lynne Leader, M.D.

## 2021-09-03 NOTE — Patient Instructions (Signed)
Good to see you today.  You had B knee Zilretta injections.  Call or go to the ER if you develop a large red swollen joint with extreme pain or oozing puss.   Follow-up as needed

## 2021-09-19 ENCOUNTER — Encounter: Payer: Self-pay | Admitting: Family Medicine

## 2021-09-19 DIAGNOSIS — I10 Essential (primary) hypertension: Secondary | ICD-10-CM

## 2021-09-20 ENCOUNTER — Other Ambulatory Visit (HOSPITAL_BASED_OUTPATIENT_CLINIC_OR_DEPARTMENT_OTHER): Payer: Self-pay

## 2021-09-20 MED ORDER — AMLODIPINE BESYLATE 10 MG PO TABS
10.0000 mg | ORAL_TABLET | Freq: Every day | ORAL | 0 refills | Status: DC
  Filled 2021-09-20: qty 90, 90d supply, fill #0

## 2021-12-06 NOTE — Progress Notes (Signed)
? ?I, Linda [name], LAT, ATC, am serving as scribe for Dr. Lynne Leader. ? ?Linda [name] is a 41 y.o. female who presents to [place] at [facility] today for f/u of B knee pain and possible B knee injections.  She was last seen by Dr. Georgina Snell on 09/03/21 and had B knee steroid injections.  Zilretta injections were to be authorized.  Today, pt reports that her B knee pain has flared up over the last 2 weeks.  She reports the R knee being worse than the L. ? ?Dx imaging: 11/11/20 L knee MRI ?            10/23/20 R knee XR ?            09/29/20 L knee XR ? ?Pertinent review of systems: no fever or chills ? ?Relevant historical information: Iron deficiency anemia and hypertension. ? ? ?Exam:  ?BP 114/82 (BP Location: Right Arm, Patient Position: Sitting, Cuff Size: Large)   Pulse (!) 54   Ht 5' 6.5" (1.689 m)   Wt (!) 312 lb 3.2 oz (141.6 kg)   SpO2 97%   BMI 49.64 kg/m?  ?General: Well Developed, well nourished, and in no acute distress.  ? ?MSK: Right knee mild effusion normal motion with crepitation. ? ?Left knee mild effusion.  Normal motion with crepitation. ? ? ? ?Lab and Radiology Results ? ?Procedure: Real-time Ultrasound Guided Injection of right knee superior lateral patellar space ?Device: Philips Affiniti 50G ?Images permanently stored and available for review in PACS ?Verbal informed consent obtained.  Discussed risks and benefits of procedure. Warned about infection, bleeding, hyperglycemia damage to structures among others. ?Patient expresses understanding and agreement ?Time-out conducted.   ?Noted no overlying erythema, induration, or other signs of local infection.   ?Skin prepped in a sterile fashion.   ?Local anesthesia: Topical Ethyl chloride.   ?With sterile technique and under real time ultrasound guidance: Zilretta 32 mg injected into knee joint. Fluid seen entering the joint capsule.   ?Completed without difficulty   ?Advised to call if fevers/chills, erythema,  induration, drainage, or persistent bleeding.   ?Images permanently stored and available for review in the ultrasound unit.  ?Impression: Technically successful ultrasound guided injection. ? ? ? ?Procedure: Real-time Ultrasound Guided Injection of left knee superior lateral patellar space ?Device: Philips Affiniti 50G ?Images permanently stored and available for review in PACS ?Verbal informed consent obtained.  Discussed risks and benefits of procedure. Warned about infection, bleeding, hyperglycemia damage to structures among others. ?Patient expresses understanding and agreement ?Time-out conducted.   ?Noted no overlying erythema, induration, or other signs of local infection.   ?Skin prepped in a sterile fashion.   ?Local anesthesia: Topical Ethyl chloride.   ?With sterile technique and under real time ultrasound guidance: Zilretta 32 mg injected into knee joint. Fluid seen entering the joint capsule.   ?Completed without difficulty   ?Advised to call if fevers/chills, erythema, induration, drainage, or persistent bleeding.   ?Images permanently stored and available for review in the ultrasound unit.  ?Impression: Technically successful ultrasound guided injection. ? ? ?Lot number: [id number] and [id number] ? ? ? ? ? ?Assessment and Plan: ?41 y.o. female with bilateral knee pain due to DJD.  Plan for Zilretta injection.  This does not last the full 3 months and is working reasonably well for her.  Plan to continue other conservative management strategies including quad strengthening and weight loss.  Recheck back with me as needed. ? ? ?PDMP not reviewed  this encounter. ?Orders Placed This Encounter  ?Procedures  ? [place] LIMITED JOINT SPACE STRUCTURES LOW BILAT(NO LINKED CHARGES)  ?  Order Specific Question:   Reason for Exam (SYMPTOM  OR DIAGNOSIS REQUIRED)  ?  Answer:   B knee pain  ?  Order Specific Question:   Preferred imaging location?  ?  Answer:   [place]  ? ?No orders of the defined  types were placed in this encounter. ? ? ? ?Discussed warning signs or symptoms. Please see discharge instructions. Patient expresses understanding. ? ? ?The above documentation has been reviewed and is accurate and complete Lynne Leader, M.D. ? ? ?

## 2021-12-07 ENCOUNTER — Ambulatory Visit: Payer: Self-pay

## 2021-12-07 ENCOUNTER — Ambulatory Visit (INDEPENDENT_AMBULATORY_CARE_PROVIDER_SITE_OTHER): Payer: 59 | Admitting: Family Medicine

## 2021-12-07 ENCOUNTER — Encounter: Payer: Self-pay | Admitting: Family Medicine

## 2021-12-07 VITALS — BP 114/82 | HR 54 | Ht 66.5 in | Wt 312.2 lb

## 2021-12-07 DIAGNOSIS — M25562 Pain in left knee: Secondary | ICD-10-CM | POA: Diagnosis not present

## 2021-12-07 DIAGNOSIS — M25561 Pain in right knee: Secondary | ICD-10-CM | POA: Diagnosis not present

## 2021-12-07 DIAGNOSIS — G8929 Other chronic pain: Secondary | ICD-10-CM | POA: Diagnosis not present

## 2021-12-07 DIAGNOSIS — M170 Bilateral primary osteoarthritis of knee: Secondary | ICD-10-CM

## 2021-12-07 NOTE — Patient Instructions (Addendum)
Good to see you today. ? ?You had B knee Zilretta injections.  Call or go to the ER if you develop a large red swollen joint with extreme pain or oozing puss.  ? ?Follow-up: as needed ?

## 2022-01-08 ENCOUNTER — Encounter: Payer: Self-pay | Admitting: Family Medicine

## 2022-01-08 ENCOUNTER — Other Ambulatory Visit (HOSPITAL_BASED_OUTPATIENT_CLINIC_OR_DEPARTMENT_OTHER): Payer: Self-pay

## 2022-01-08 ENCOUNTER — Other Ambulatory Visit: Payer: Self-pay | Admitting: *Deleted

## 2022-01-08 ENCOUNTER — Other Ambulatory Visit: Payer: Self-pay | Admitting: Family Medicine

## 2022-01-08 DIAGNOSIS — I10 Essential (primary) hypertension: Secondary | ICD-10-CM

## 2022-01-08 MED ORDER — AMLODIPINE BESYLATE 10 MG PO TABS: 10.0000 mg | ORAL_TABLET | Freq: Every day | ORAL | 0 refills | Status: DC

## 2022-01-08 MED ORDER — AMLODIPINE BESYLATE 10 MG PO TABS
10.0000 mg | ORAL_TABLET | Freq: Every day | ORAL | 0 refills | Status: DC
  Filled 2022-01-08: qty 90, 90d supply, fill #0

## 2022-03-08 ENCOUNTER — Ambulatory Visit: Payer: Self-pay

## 2022-03-08 ENCOUNTER — Ambulatory Visit (INDEPENDENT_AMBULATORY_CARE_PROVIDER_SITE_OTHER): Payer: 59 | Admitting: Family Medicine

## 2022-03-08 DIAGNOSIS — G8929 Other chronic pain: Secondary | ICD-10-CM | POA: Diagnosis not present

## 2022-03-08 DIAGNOSIS — M170 Bilateral primary osteoarthritis of knee: Secondary | ICD-10-CM

## 2022-03-08 DIAGNOSIS — M25561 Pain in right knee: Secondary | ICD-10-CM

## 2022-03-08 DIAGNOSIS — M25562 Pain in left knee: Secondary | ICD-10-CM | POA: Diagnosis not present

## 2022-03-08 NOTE — Progress Notes (Signed)
Linda [name] presents to clinic today for Zilretta injection bilateral knees.  Last injection was 3 months ago on April 14.  Procedure: Real-time Ultrasound Guided Injection of the knee superior lateral patellar space Device: Philips Affiniti 50G Images permanently stored and available for review in PACS Verbal informed consent obtained.  Discussed risks and benefits of procedure. Warned about infection, bleeding, hyperglycemia damage to structures among others. Patient expresses understanding and agreement Time-out conducted.   Noted no overlying erythema, induration, or other signs of local infection.   Skin prepped in a sterile fashion.   Local anesthesia: Topical Ethyl chloride.   With sterile technique and under real time ultrasound guidance: Zilretta 32 mg injected into knee joint. Fluid seen entering the joint capsule.   Completed without difficulty   Spinal needle used Advised to call if fevers/chills, erythema, induration, drainage, or persistent bleeding.   Images permanently stored and available for review in the ultrasound unit.  Impression: Technically successful ultrasound guided injection.    Procedure: Real-time Ultrasound Guided Injection of left knee superior lateral patellar space Device: Philips Affiniti 50G Images permanently stored and available for review in PACS Verbal informed consent obtained.  Discussed risks and benefits of procedure. Warned about infection, bleeding, hyperglycemia damage to structures among others. Patient expresses understanding and agreement Time-out conducted.   Noted no overlying erythema, induration, or other signs of local infection.   Skin prepped in a sterile fashion.   Local anesthesia: Topical Ethyl chloride.   With sterile technique and under real time ultrasound guidance: Zilretta 32 mg injected into knee joint. Fluid seen entering the joint capsule.   Completed without difficulty   Spinal needle used Advised to call if  fevers/chills, erythema, induration, drainage, or persistent bleeding.   Images permanently stored and available for review in the ultrasound unit.  Impression: Technically successful ultrasound guided injection.  Lot number: [id number] for both injections.  Return in 3 months.  Next injection is in soon as 3 months.

## 2022-03-08 NOTE — Patient Instructions (Addendum)
Thank you for coming in today.   You received an injection today. Seek immediate medical attention if the joint becomes red, extremely painful, or is oozing fluid.   Check back in 3 months 

## 2022-03-21 ENCOUNTER — Ambulatory Visit (INDEPENDENT_AMBULATORY_CARE_PROVIDER_SITE_OTHER): Payer: 59 | Admitting: Family Medicine

## 2022-03-21 ENCOUNTER — Other Ambulatory Visit (HOSPITAL_BASED_OUTPATIENT_CLINIC_OR_DEPARTMENT_OTHER): Payer: Self-pay

## 2022-03-21 ENCOUNTER — Encounter: Payer: Self-pay | Admitting: Family Medicine

## 2022-03-21 VITALS — BP 111/66 | HR 76 | Temp 98.1°F | Ht 66.5 in | Wt 314.6 lb

## 2022-03-21 DIAGNOSIS — G8929 Other chronic pain: Secondary | ICD-10-CM | POA: Diagnosis not present

## 2022-03-21 DIAGNOSIS — M25561 Pain in right knee: Secondary | ICD-10-CM | POA: Diagnosis not present

## 2022-03-21 DIAGNOSIS — R7309 Other abnormal glucose: Secondary | ICD-10-CM

## 2022-03-21 DIAGNOSIS — I10 Essential (primary) hypertension: Secondary | ICD-10-CM | POA: Diagnosis not present

## 2022-03-21 DIAGNOSIS — M170 Bilateral primary osteoarthritis of knee: Secondary | ICD-10-CM

## 2022-03-21 DIAGNOSIS — Z13220 Encounter for screening for lipoid disorders: Secondary | ICD-10-CM | POA: Diagnosis not present

## 2022-03-21 DIAGNOSIS — M2550 Pain in unspecified joint: Secondary | ICD-10-CM | POA: Diagnosis not present

## 2022-03-21 DIAGNOSIS — M25562 Pain in left knee: Secondary | ICD-10-CM | POA: Diagnosis not present

## 2022-03-21 DIAGNOSIS — Z0001 Encounter for general adult medical examination with abnormal findings: Secondary | ICD-10-CM | POA: Diagnosis not present

## 2022-03-21 DIAGNOSIS — Z1159 Encounter for screening for other viral diseases: Secondary | ICD-10-CM | POA: Diagnosis not present

## 2022-03-21 LAB — CBC
HCT: 35.9 % — ABNORMAL LOW (ref 36.0–46.0)
Hemoglobin: 11.3 g/dL — ABNORMAL LOW (ref 12.0–15.0)
MCHC: 31.4 g/dL (ref 30.0–36.0)
MCV: 74.5 fl — ABNORMAL LOW (ref 78.0–100.0)
Platelets: 328 10*3/uL (ref 150.0–400.0)
RBC: 4.82 Mil/uL (ref 3.87–5.11)
RDW: 17.7 % — ABNORMAL HIGH (ref 11.5–15.5)
WBC: 11.3 10*3/uL — ABNORMAL HIGH (ref 4.0–10.5)

## 2022-03-21 LAB — HEMOGLOBIN A1C: Hgb A1c MFr Bld: 7.1 % — ABNORMAL HIGH (ref 4.6–6.5)

## 2022-03-21 MED ORDER — TRIAMCINOLONE ACETONIDE 32 MG IX SRER
32.0000 mg | Freq: Once | INTRA_ARTICULAR | Status: AC
  Administered 2022-03-21: 32 mg via INTRA_ARTICULAR

## 2022-03-21 MED ORDER — AMLODIPINE BESYLATE 10 MG PO TABS
10.0000 mg | ORAL_TABLET | Freq: Every day | ORAL | 3 refills | Status: DC
  Filled 2022-03-21: qty 90, 90d supply, fill #0
  Filled 2022-06-27: qty 90, 90d supply, fill #1
  Filled 2022-10-07: qty 90, 90d supply, fill #2
  Filled 2022-12-29: qty 90, 90d supply, fill #3

## 2022-03-21 NOTE — Progress Notes (Signed)
Chief Complaint:  Linda [name] is a 41 y.o. female who presents today for her annual comprehensive physical exam.    Assessment/Plan:  Chronic Problems Addressed Today: Elevated glucose Check A1c.  Her last A1c was 6.7.  We will start Mounjaro 2.5 mg weekly for 4 weeks and then increase the dose as tolerated.  We can have her come back in 3 to 6 months to recheck A1c depending on today's reading.  Polyarthralgia Follows with sports medicine.  She will likely have significant improvement in her joint pain with weight loss.  We will be starting Mounjaro as above for her glucose control which should help with weight loss as well.  Morbid obesity ([place]) BMI is 50 today.  Several comorbidities.  We will be starting [facility] for her glycemic control but this will also help with weight management.  We will start 2.5 mg weekly for 4 weeks and then increase as tolerated.  Essential hypertension Doing well on amlodipine 10 mg daily.  Refill sent in today.  Preventative Healthcare: Check labs.  Up to date on pap.   Patient Counseling(The following topics were reviewed and/or handout was given):  -Nutrition: Stressed importance of moderation in sodium/caffeine intake, saturated fat and cholesterol, caloric balance, sufficient intake of fresh fruits, vegetables, and fiber.  -Stressed the importance of regular exercise.   -Substance Abuse: Discussed cessation/primary prevention of tobacco, alcohol, or other drug use; driving or other dangerous activities under the influence; availability of treatment for abuse.   -Injury prevention: Discussed safety belts, safety helmets, smoke detector, smoking near bedding or upholstery.   -Sexuality: Discussed sexually transmitted diseases, partner selection, use of condoms, avoidance of unintended pregnancy and contraceptive alternatives.   -Dental health: Discussed importance of regular tooth brushing, flossing, and dental visits.  -Health maintenance  and immunizations reviewed. Please refer to Health maintenance section.  Return to care in 1 year for next preventative visit.     Subjective:  HPI:  She has no acute complaints today. See A/p for status of chronic conditions.   Lifestyle Diet: Trying to cut down on portions Exercise: Limited due to knee pain.      03/21/2022    1:56 PM  Depression screen PHQ 2/9  Decreased Interest 0  Down, Depressed, Hopeless 0  PHQ - 2 Score 0    Health Maintenance Due  Topic Date Due   Hepatitis C Screening  Never done     ROS: Per HPI, otherwise a complete review of systems was negative.   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   Abnormal uterine bleeding 07/06/2021   Morbid obesity ([place]) 11/19/2019   Polyarthralgia 11/19/2019   Elevated glucose 11/19/2019   Left knee pain 02/08/2019   Essential hypertension 02/08/2019   Gastroesophageal reflux disease 02/08/2019   Frequent headaches 02/08/2019   Iron deficiency anemia 03/27/2017   Past Surgical History:  Procedure Laterality Date   CESAREAN SECTION      Family History  Problem Relation Age of Onset   Hypertension Mother    Hypertension Father     Medications- reviewed and updated Current Outpatient Medications  Medication Sig Dispense Refill   acetaminophen (TYLENOL) 500 MG tablet Take 1,000-2,000 mg by mouth daily.  famotidine (PEPCID) 10 MG tablet Take 10 mg by mouth daily. OTC     MELATONIN PO Take by mouth.     amLODipine (NORVASC) 10 MG tablet Take 1 tablet (10 mg total) by mouth daily. 90 tablet 3   No current facility-administered medications for this visit.    Allergies-reviewed and updated No Known Allergies  Social History   Socioeconomic History   Marital  status: Married    Spouse name: Not on file   Number of children: Not on file   Years of education: Not on file   Highest education level: Not on file  Occupational History   Not on file  Tobacco Use   Smoking status: Never   Smokeless tobacco: Never  Vaping Use   Vaping Use: Never used  Substance and Sexual Activity   Alcohol use: Yes    Comment: occ   Drug use: Never   Sexual activity: Yes    Partners: Male    Birth control/protection: Condom  Other Topics Concern   Not on file  Social History Narrative   Not on file   Social Determinants of Health   Financial Resource Strain: Not on file  Food Insecurity: Not on file  Transportation Needs: Not on file  Physical Activity: Not on file  Stress: Not on file  Social Connections: Not on file        Objective:  Physical Exam: BP 111/66   Pulse 76   Temp 98.1 F (36.7 C) (Temporal)   Ht 5' 6.5" (1.689 m)   Wt (!) 314 lb 9.6 oz (142.7 kg)   LMP 02/18/2022   SpO2 99%   BMI 50.02 kg/m   Body mass index is 50.02 kg/m. Wt Readings from Last 3 Encounters:  03/21/22 (!) 314 lb 9.6 oz (142.7 kg)  12/07/21 (!) 312 lb 3.2 oz (141.6 kg)  09/03/21 (!) 321 lb (145.6 kg)   Gen: NAD, resting comfortably HEENT: TMs normal bilaterally. OP clear. No thyromegaly noted.  CV: RRR with no murmurs appreciated Pulm: NWOB, CTAB with no crackles, wheezes, or rhonchi GI: Normal bowel sounds present. Soft, Nontender, Nondistended. MSK: no edema, cyanosis, or clubbing noted Skin: warm, dry Neuro: CN2-12 grossly intact. Strength 5/5 in upper and lower extremities. Reflexes symmetric and intact bilaterally.  Psych: Normal affect and thought content     Montae Stager M. Jerline Pain, MD 03/21/2022 2:37 PM

## 2022-03-21 NOTE — Assessment & Plan Note (Signed)
BMI is 50 today.  Several comorbidities.  We will be starting [facility] for her glycemic control but this will also help with weight management.  We will start 2.5 mg weekly for 4 weeks and then increase as tolerated.

## 2022-03-21 NOTE — Assessment & Plan Note (Signed)
Follows with sports medicine.  She will likely have significant improvement in her joint pain with weight loss.  We will be starting Mounjaro as above for her glucose control which should help with weight loss as well.

## 2022-03-21 NOTE — Assessment & Plan Note (Signed)
Doing well on amlodipine 10 mg daily.  Refill sent in today.

## 2022-03-21 NOTE — Assessment & Plan Note (Signed)
Check A1c.  Her last A1c was 6.7.  We will start Mounjaro 2.5 mg weekly for 4 weeks and then increase the dose as tolerated.  We can have her come back in 3 to 6 months to recheck A1c depending on today's reading.

## 2022-03-21 NOTE — Addendum Note (Signed)
Addended by: Mare Ferrari on: 03/21/2022 11:30 AM   Modules accepted: Orders

## 2022-03-21 NOTE — Patient Instructions (Signed)
It was very nice to see you today!  We will start Mounjaro.  Please take 2.5 mg weekly for 4 weeks.  Send a message in 3 to 4 weeks to let me know how this is working for you and we can increase the dose as tolerated.  We will check your blood work today.  Please continue to work on diet and exercise.  I will see back in year for your next physical.  Please come back to see me sooner if needed.  Take care, Dr Jerline Pain  PLEASE NOTE:  If you had any lab tests please let us know if you have not heard back within a few days. You may see your results on mychart before we have a chance to review them but we will give you a call once they are reviewed by [place]. If we ordered any referrals today, please let us know if you have not heard from their office within the next week.   Please try these tips to maintain a healthy lifestyle:  Eat at least 3 REAL meals and 1-2 snacks per day.  Aim for no more than 5 hours between eating.  If you eat breakfast, please do so within one hour of getting up.   Each meal should contain half fruits/vegetables, one quarter protein, and one quarter carbs (no bigger than a computer mouse)  Cut down on sweet beverages. This includes juice, soda, and sweet tea.   Drink at least 1 glass of water with each meal and aim for at least 8 glasses per day  Exercise at least 150 minutes every week.    Preventive Care 50-25 Years Old, Female Preventive care refers to lifestyle choices and visits with your health care provider that can promote health and wellness. Preventive care visits are also called wellness exams. What can I expect for my preventive care visit? Counseling During your preventive care visit, your health care provider may ask about your: Medical history, including: Past medical problems. Family medical history. Pregnancy history. Current health, including: Menstrual cycle. Method of birth control. Emotional well-being. Home life and relationship  well-being. Sexual activity and sexual health. Lifestyle, including: Alcohol, nicotine or tobacco, and drug use. Access to firearms. Diet, exercise, and sleep habits. Work and work Statistician. Sunscreen use. Safety issues such as seatbelt and bike helmet use. Physical exam Your health care provider may check your: Height and weight. These may be used to calculate your BMI (body mass index). BMI is a measurement that tells if you are at a healthy weight. Waist circumference. This measures the distance around your waistline. This measurement also tells if you are at a healthy weight and may help predict your risk of certain diseases, such as type 2 diabetes and high blood pressure. Heart rate and blood pressure. Body temperature. Skin for abnormal spots. What immunizations do I need?  Vaccines are usually given at various ages, according to a schedule. Your health care provider will recommend vaccines for you based on your age, medical history, and lifestyle or other factors, such as travel or where you work. What tests do I need? Screening Your health care provider may recommend screening tests for certain conditions. This may include: Pelvic exam and Pap test. Lipid and cholesterol levels. Diabetes screening. This is done by checking your blood sugar (glucose) after you have not eaten for a while (fasting). Hepatitis B test. Hepatitis C test. HIV (human immunodeficiency virus) test. STI (sexually transmitted infection) testing, if you are at risk. BRCA-related  cancer screening. This may be done if you have a family history of breast, ovarian, tubal, or peritoneal cancers. Talk with your health care provider about your test results, treatment options, and if necessary, the need for more tests. Follow these instructions at home: Eating and drinking  Eat a healthy diet that includes fresh fruits and vegetables, whole grains, lean protein, and low-fat dairy products. Take vitamin and  mineral supplements as recommended by your health care provider. Do not drink alcohol if: Your health care provider tells you not to drink. You are pregnant, may be pregnant, or are planning to become pregnant. If you drink alcohol: Limit how much you have to 0-1 drink a day. Know how much alcohol is in your drink. In the [place] one drink equals one 12 oz bottle of beer (355 mL), one 5 oz glass of wine (148 mL), or one 1 oz glass of hard liquor (44 mL). Lifestyle Brush your teeth every morning and night with fluoride toothpaste. Floss one time each day. Exercise for at least 30 minutes 5 or more days each week. Do not use any products that contain nicotine or tobacco. These products include cigarettes, chewing tobacco, and vaping devices, such as e-cigarettes. If you need help quitting, ask your health care provider. Do not use drugs. If you are sexually active, practice safe sex. Use a condom or other form of protection to prevent STIs. If you do not wish to become pregnant, use a form of birth control. If you plan to become pregnant, see your health care provider for a prepregnancy visit. Find healthy ways to manage stress, such as: Meditation, yoga, or listening to music. Journaling. Talking to a trusted person. Spending time with friends and family. Minimize exposure to UV radiation to reduce your risk of skin cancer. Safety Always wear your seat belt while driving or riding in a vehicle. Do not drive: If you have been drinking alcohol. Do not ride with someone who has been drinking. If you have been using any mind-altering substances or drugs. While texting. When you are tired or distracted. Wear a helmet and other protective equipment during sports activities. If you have firearms in your house, make sure you follow all gun safety procedures. Seek help if you have been physically or sexually abused. What's next? Go to your health care provider once a year for an annual wellness  visit. Ask your health care provider how often you should have your eyes and teeth checked. Stay up to date on all vaccines. This information is not intended to replace advice given to you by your health care provider. Make sure you discuss any questions you have with your health care provider. Document Revised: 02/07/2021 Document Reviewed: 02/07/2021 Elsevier Patient Education  [place].

## 2022-03-22 ENCOUNTER — Telehealth: Payer: Self-pay | Admitting: Family Medicine

## 2022-03-22 LAB — COMPREHENSIVE METABOLIC PANEL
ALT: 19 U/L (ref 0–35)
AST: 10 U/L (ref 0–37)
Albumin: 3.7 g/dL (ref 3.5–5.2)
Alkaline Phosphatase: 81 U/L (ref 39–117)
BUN: 12 mg/dL (ref 6–23)
CO2: 29 mEq/L (ref 19–32)
Calcium: 9 mg/dL (ref 8.4–10.5)
Chloride: 103 mEq/L (ref 96–112)
Creatinine, Ser: 0.88 mg/dL (ref 0.40–1.20)
GFR: 82.68 mL/min (ref >60.00)
Glucose, Bld: 94 mg/dL (ref 70–99)
Potassium: 3.7 mEq/L (ref 3.5–5.1)
Sodium: 139 mEq/L (ref 135–145)
Total Bilirubin: 0.3 mg/dL (ref 0.2–1.2)
Total Protein: 7.1 g/dL (ref 6.0–8.3)

## 2022-03-22 LAB — LIPID PANEL
Cholesterol: 194 mg/dL (ref 0–200)
HDL: 61.9 mg/dL (ref >39.00)
LDL Cholesterol: 120 mg/dL — ABNORMAL HIGH (ref 0–99)
NonHDL: 131.62
Total CHOL/HDL Ratio: 3
Triglycerides: 57 mg/dL (ref 0.0–149.0)
VLDL: 11.4 mg/dL (ref 0.0–40.0)

## 2022-03-22 LAB — TSH: TSH: 1.21 u[IU]/mL (ref 0.35–5.50)

## 2022-03-22 LAB — HEPATITIS C ANTIBODY: Hepatitis C Ab: NONREACTIVE

## 2022-03-22 NOTE — Telephone Encounter (Signed)
Patient called stating she was reached out to by [facility] in regards to a referral placed by her PCP on 07/27 for a screening colonoscopy. Patient states that although this referral was closed upon her declining services, she wondered why this placed in the first place. States nothing of the sort was discussed during her physical on 07/27 and she is not the age to start having colonoscopy screenings. Patient is requesting a call back with additional info on the matter. Please advise.

## 2022-03-22 NOTE — Progress Notes (Signed)
Please inform patient of the following:  Cholesterol is up a bit since last time.  Do not eat start meds but continue to work on diet and exercise.  Hopefully weight loss will help with this.  Her white blood cell counts are up a little bit.  This is probably due to the cortisone shots that she has been receiving.She is slightly more anemic than last time as well.  Please make sure that she is taking her iron supplementation.  Her A1c is up compared to last time at 7.1.  This is in the diabetic range.  The Darcel Bayley will help with this.  I would like to see her back in about 3 months to recheck though I would like for her to follow-up within a few weeks on MyChart to let us know how the Darcel Bayley is working and we can increase the dose as tolerated.

## 2022-03-22 NOTE — Telephone Encounter (Signed)
Spoke with patient, patient aware referral cancelled

## 2022-04-15 ENCOUNTER — Encounter: Payer: Self-pay | Admitting: Family Medicine

## 2022-04-16 NOTE — Telephone Encounter (Signed)
Please advise 

## 2022-04-18 NOTE — Telephone Encounter (Signed)
Please verify her current dose is 2.'5mg'$  weekly. If so, ok to increase to '5mg'$  weekly.  Algis Greenhouse. Jerline Pain, MD 04/18/2022 12:33 PM

## 2022-04-22 ENCOUNTER — Other Ambulatory Visit (HOSPITAL_BASED_OUTPATIENT_CLINIC_OR_DEPARTMENT_OTHER): Payer: Self-pay

## 2022-04-22 ENCOUNTER — Other Ambulatory Visit: Payer: Self-pay | Admitting: *Deleted

## 2022-04-22 MED ORDER — TIRZEPATIDE 5 MG/0.5ML ~~LOC~~ SOAJ
5.0000 mg | SUBCUTANEOUS | 1 refills | Status: DC
  Filled 2022-04-22: qty 6, 84d supply, fill #0

## 2022-04-23 ENCOUNTER — Other Ambulatory Visit (HOSPITAL_BASED_OUTPATIENT_CLINIC_OR_DEPARTMENT_OTHER): Payer: Self-pay

## 2022-04-23 ENCOUNTER — Telehealth: Payer: Self-pay | Admitting: *Deleted

## 2022-04-23 NOTE — Telephone Encounter (Signed)
Key: [id number] - PA Case ID: [id number] - Rx #: [id number] Status Sent to Plan today Drug Mounjaro '5MG'$ /0.5ML pen-injectors Waiting for determination

## 2022-04-24 ENCOUNTER — Other Ambulatory Visit (HOSPITAL_BASED_OUTPATIENT_CLINIC_OR_DEPARTMENT_OTHER): Payer: Self-pay

## 2022-04-25 NOTE — Telephone Encounter (Signed)
Rx Linda [name] been approved from 04/23/2022 till [id number] Patient notified

## 2022-05-20 ENCOUNTER — Encounter: Payer: Self-pay | Admitting: *Deleted

## 2022-06-10 ENCOUNTER — Ambulatory Visit (INDEPENDENT_AMBULATORY_CARE_PROVIDER_SITE_OTHER): Payer: 59 | Admitting: Family Medicine

## 2022-06-10 ENCOUNTER — Ambulatory Visit: Payer: Self-pay

## 2022-06-10 VITALS — BP 164/92 | HR 79 | Ht 66.5 in | Wt 302.0 lb

## 2022-06-10 DIAGNOSIS — G8929 Other chronic pain: Secondary | ICD-10-CM | POA: Diagnosis not present

## 2022-06-10 DIAGNOSIS — M25562 Pain in left knee: Secondary | ICD-10-CM

## 2022-06-10 DIAGNOSIS — M25561 Pain in right knee: Secondary | ICD-10-CM | POA: Diagnosis not present

## 2022-06-10 DIAGNOSIS — M170 Bilateral primary osteoarthritis of knee: Secondary | ICD-10-CM

## 2022-06-10 MED ORDER — TRIAMCINOLONE ACETONIDE 32 MG IX SRER
64.0000 mg | Freq: Once | INTRA_ARTICULAR | Status: AC
  Administered 2022-06-10: 64 mg via INTRA_ARTICULAR

## 2022-06-10 NOTE — Progress Notes (Signed)
I, Peterson Lombard, LAT, ATC acting as a scribe for Lynne Leader, MD.  Linda [name] is a 41 y.o. female who presents to [place] [facility] today for cont'd bilat knee pain. Pt was last seen by Dr.Kraven Calk on 03/08/22 and was given repeat Zilretta injections. Today, pt reports prior Zilretta injections lasts about 1.5 months. No swelling noted.   She is currently working on weight loss.  Her primary care provider has prescribed Mounjaro help with diabetes and weight loss.  She is currently on 5 mg and has lost some weight.   Pertinent review of systems: No fevers or chills  Relevant historical information: Obesity and diabetes.   Exam:  BP (!) 164/92   Pulse 79   Ht 5' 6.5" (1.689 m)   Wt (!) 302 lb (137 kg)   SpO2 98%   BMI 48.01 kg/m  General: Well Developed, well nourished, and in no acute distress.   MSK: Bilateral knees mild effusion normal motion with crepitation.    Lab and Radiology Results  Zilretta injection bilateral knees Procedure: Real-time Ultrasound Guided Injection of left knee superior lateral patellar space Device: Philips Affiniti 50G Images permanently stored and available for review in PACS Verbal informed consent obtained.  Discussed risks and benefits of procedure. Warned about infection, bleeding, hyperglycemia damage to structures among others. Patient expresses understanding and agreement Time-out conducted.   Noted no overlying erythema, induration, or other signs of local infection.   Skin prepped in a sterile fashion.   Local anesthesia: Topical Ethyl chloride.   With sterile technique and under real time ultrasound guidance: Zilretta 32 mg injected into the joint. Fluid seen entering the joint capsule.   Completed without difficulty   Spinal needle used Advised to call if fevers/chills, erythema, induration, drainage, or persistent bleeding.   Images permanently stored and available for review in the ultrasound  unit.  Impression: Technically successful ultrasound guided injection.    Procedure: Real-time Ultrasound Guided Injection of right knee superior lateral patellar space Device: Philips Affiniti 50G Images permanently stored and available for review in PACS Verbal informed consent obtained.  Discussed risks and benefits of procedure. Warned about infection, bleeding, hyperglycemia damage to structures among others. Patient expresses understanding and agreement Time-out conducted.   Noted no overlying erythema, induration, or other signs of local infection.   Skin prepped in a sterile fashion.   Local anesthesia: Topical Ethyl chloride.   With sterile technique and under real time ultrasound guidance: Zilretta 32 mg injected into knee joint. Fluid seen entering the joint capsule.   Completed without difficulty   Spinal needle used Advised to call if fevers/chills, erythema, induration, drainage, or persistent bleeding.   Images permanently stored and available for review in the ultrasound unit.  Impression: Technically successful ultrasound guided injection. Lot number: [id number] for both injections       Assessment and Plan: 41 y.o. female with bilateral knee pain due to DJD. Unfortunately even Zilretta is not lasting 3 months.  Plan for repeat Zilretta injection today but also refer to [facility] for genicular nerve ablation.  Her long term plan should be weight loss.  She is currently taking Mounjaro for diabetes and obesity which should result in up to 50 pounds or so of weight loss at maximum dose.  This will likely help significantly to improve her knee pain.  If she cannot get sufficient weight loss and pain reduction with medication bariatric surgery may be indicated.  However if ultimately she cannot  get to where she needs to be with the above treatment plan would recommend repeat x-ray and potentially even MRI of the knees for potential surgical planning.   PDMP not reviewed this  encounter. Orders Placed This Encounter  Procedures   [place] LIMITED JOINT SPACE STRUCTURES LOW BILAT(NO LINKED CHARGES)    Order Specific Question:   Reason for Exam (SYMPTOM  OR DIAGNOSIS REQUIRED)    Answer:   bilateral knee pain    Order Specific Question:   Preferred imaging location?    Answer:   [place]   Ambulatory referral to Physical Medicine Rehab    Referral Priority:   Routine    Referral Type:   Rehabilitation    Referral Reason:   Specialty Services Required    Referred to Provider:   Magnus Sinning, MD    Requested Specialty:   Physical Medicine and Rehabilitation    Number of Visits Requested:   1   Meds ordered this encounter  Medications   Triamcinolone Acetonide (ZILRETTA) intra-articular injection 64 mg     Discussed warning signs or symptoms. Please see discharge instructions. Patient expresses understanding.   The above documentation has been reviewed and is accurate and complete Lynne Leader, M.D.

## 2022-06-10 NOTE — Patient Instructions (Addendum)
Thank you for coming in today.   You received an injection today. Seek immediate medical attention if the joint becomes red, extremely painful, or is oozing fluid.   Be sure to send [place] your new insurance card.  I've referred you to [facility] to see Dr. Ernestina Patches.  Let us know if you don't hear from them in one week.   Check back as needed

## 2022-06-21 ENCOUNTER — Ambulatory Visit (INDEPENDENT_AMBULATORY_CARE_PROVIDER_SITE_OTHER): Payer: 59 | Admitting: Physical Medicine and Rehabilitation

## 2022-06-21 ENCOUNTER — Encounter: Payer: Self-pay | Admitting: Physical Medicine and Rehabilitation

## 2022-06-21 ENCOUNTER — Ambulatory Visit: Payer: 59 | Admitting: Family Medicine

## 2022-06-21 ENCOUNTER — Other Ambulatory Visit (HOSPITAL_BASED_OUTPATIENT_CLINIC_OR_DEPARTMENT_OTHER): Payer: Self-pay

## 2022-06-21 ENCOUNTER — Encounter: Payer: Self-pay | Admitting: Family Medicine

## 2022-06-21 VITALS — BP 146/91 | HR 59

## 2022-06-21 VITALS — BP 125/80 | HR 74 | Temp 98.0°F | Ht 66.5 in | Wt 300.8 lb

## 2022-06-21 DIAGNOSIS — I10 Essential (primary) hypertension: Secondary | ICD-10-CM

## 2022-06-21 DIAGNOSIS — M2242 Chondromalacia patellae, left knee: Secondary | ICD-10-CM

## 2022-06-21 DIAGNOSIS — E1165 Type 2 diabetes mellitus with hyperglycemia: Secondary | ICD-10-CM | POA: Diagnosis not present

## 2022-06-21 DIAGNOSIS — D509 Iron deficiency anemia, unspecified: Secondary | ICD-10-CM

## 2022-06-21 DIAGNOSIS — M25562 Pain in left knee: Secondary | ICD-10-CM

## 2022-06-21 DIAGNOSIS — G8929 Other chronic pain: Secondary | ICD-10-CM

## 2022-06-21 DIAGNOSIS — R7309 Other abnormal glucose: Secondary | ICD-10-CM | POA: Diagnosis not present

## 2022-06-21 LAB — POCT GLYCOSYLATED HEMOGLOBIN (HGB A1C): Hemoglobin A1C: 5.6 % (ref 4.0–5.6)

## 2022-06-21 MED ORDER — TIRZEPATIDE 7.5 MG/0.5ML ~~LOC~~ SOAJ
7.5000 mg | SUBCUTANEOUS | 1 refills | Status: DC
  Filled 2022-06-21 – 2022-07-03 (×3): qty 2, 28d supply, fill #0

## 2022-06-21 NOTE — Patient Instructions (Signed)
It was very nice to see you today!  I am glad that you are doing well!  Your blood sugar looks great.  We will increase your Mounjaro to 7.5 mg weekly.  Please send me a message in a few weeks after starting the higher dose to let me know how you are doing and we can increase the dose as tolerated.  You can try taking fiber supplement to help with GI symptoms.  I will see you back in 3 to 6 months.  Please come back to see me sooner if needed.  Please keep an eye on your blood pressure and let me know if it is persistently elevated to 140/90 or higher.  Take care, Dr Jerline Pain  PLEASE NOTE:  If you had any lab tests please let us know if you have not heard back within a few days. You may see your results on mychart before we have a chance to review them but we will give you a call once they are reviewed by [place]. If we ordered any referrals today, please let us know if you have not heard from their office within the next week.   Please try these tips to maintain a healthy lifestyle:  Eat at least 3 REAL meals and 1-2 snacks per day.  Aim for no more than 5 hours between eating.  If you eat breakfast, please do so within one hour of getting up.   Each meal should contain half fruits/vegetables, one quarter protein, and one quarter carbs (no bigger than a computer mouse)  Cut down on sweet beverages. This includes juice, soda, and sweet tea.   Drink at least 1 glass of water with each meal and aim for at least 8 glasses per day  Exercise at least 150 minutes every week.

## 2022-06-21 NOTE — Assessment & Plan Note (Signed)
On iron supplementation.  We will recheck CBC and iron panel with next blood draw.

## 2022-06-21 NOTE — Progress Notes (Unsigned)
Linda [name] - 41 y.o. female MRN [id number]  Date of birth: Jun 02, 1981  Office Visit Note: Visit Date: 06/21/2022 PCP: Vivi Barrack, MD Referred by: Gregor Hams, MD  Subjective: Chief Complaint  Patient presents with   Left Knee - Pain   HPI: Linda [name] is a 41 y.o. female who comes in today per the request of Dr. Lynne Leader for evaluation of chronic, worsening and severe left sided knee pain. Pain ongoing for several years and is exacerbated by movement and activity. She describes pain as sore and aching sensation, currently rates as 8 out of 10. Also reports popping and locking sounds with walking and activity. Patient reports difficulty sleeping due to severe pain. Some relief of pain with home exercise regimen, rest and use of medications. States she has tried compression stockings, Meloxicam and Celebrex with minimal relief of pain. MRI imaging of left knee from March of 2022 exhibits small radial tear of the free edge of the posterior horn of the medial meniscus, partial-thickness cartilage loss of the lateral patellar facet and patellar apex with reactive marrow edema. There is also mild partial-thickness cartilage loss of the medial femorotibial compartment. Patient has underwent steroid injections and Zilretta injections with Dr. Georgina Snell, she reports short lived pain relief with these procedures. Patient was evaluated by Dr. Alphonzo Severance in our office in 2022, per his notes would consider surgical intervention, however patients case is complicated due to morbid obesity. Patient is currently working as Equities trader at [employer]. Patient denies focal weakness, numbness and tingling. Patient denies recent trauma or falls.    Review of Systems  Musculoskeletal:  Positive for joint pain.  Neurological:  Negative for tingling, sensory change, focal weakness and weakness.  All other systems reviewed and are negative.  Otherwise per HPI.  Assessment &  Plan: Visit Diagnoses:    ICD-10-CM   1. Chronic pain of left knee  M25.562 Ambulatory referral to Physical Medicine Rehab   G89.29     2. Chondromalacia patellae, left knee  M22.42 Ambulatory referral to Physical Medicine Rehab    3. Morbid (severe) obesity due to excess calories (HCC)  E66.01 Ambulatory referral to Physical Medicine Rehab       Plan: Findings:  Chronic, worsening and severe left-sided knee pain.  Patient continues to have severe pain despite good conservative therapies such as home exercise regimen, rest and use of medications.  Patient's clinical presentation and exam are consistent with intrinsic knee issue. There is small radial tear of medial meniscus and cartilage loss on MRI imaging. Next step is to perform diagnostic and hopefully therapeutic left genicular nerve block under fluoroscopic guidance. If patient gets good relief with diagnostic genicular nerve blocks we did discuss the possibility of longer sustained relief of pain with radiofrequency ablation procedure.  I did provide patient with educational material regarding ablation procedure to take home and review. Unsure if patient would be surgical candidate at this time due to body habitus. She is currently working on weight loss and is hopeful she can become more active. No red flag symptoms noted upon exam today.     Meds & Orders: No orders of the defined types were placed in this encounter.   Orders Placed This Encounter  Procedures   Ambulatory referral to Physical Medicine Rehab    Follow-up: Return for Left genicular nerve block.   Procedures: No procedures performed      Clinical History: EXAM: MRI OF THE LEFT KNEE WITHOUT CONTRAST  TECHNIQUE: Multiplanar, multisequence MR imaging of the knee was performed. No intravenous contrast was administered.   COMPARISON:  None.   FINDINGS: MENISCI   Medial: Small radial tear of the free edge of the posterior horn of the medial meniscus.    Lateral: Intact.   LIGAMENTS   Cruciates: ACL and PCL are intact.   Collaterals: Medial collateral ligament is intact. Lateral collateral ligament complex is intact.   CARTILAGE   Patellofemoral: Partial-thickness cartilage loss of the lateral patellar facet and patellar apex with subchondral reactive marrow edema.   Medial: Mild partial-thickness cartilage loss of the medial femorotibial compartment.   Lateral:  No chondral defect.   JOINT: Small joint effusion. Mild edema in superolateral Hoffa's. No plical thickening.   POPLITEAL FOSSA: Popliteus tendon is intact. Tiny Baker's cyst.   EXTENSOR MECHANISM: Intact quadriceps tendon. Intact patellar tendon. Intact lateral patellar retinaculum. Intact medial patellar retinaculum. Intact MPFL.   BONES: No aggressive osseous lesion. No fracture or dislocation.   Other: No fluid collection or hematoma. Muscles are normal.   IMPRESSION: 1. Small radial tear of the free edge of the posterior horn of the medial meniscus. 2. Partial-thickness cartilage loss of the lateral patellar facet and patellar apex with subchondral reactive marrow edema. 3. Mild partial-thickness cartilage loss of the medial femorotibial compartment.     Electronically Signed   By: Kathreen Devoid   On: 11/12/2020 09:11   She reports that she has never smoked. She has never used smokeless tobacco.  Recent Labs    03/21/22 1433 06/21/22 0910  HGBA1C 7.1* 5.6    Objective:  VS:  HT:    WT:   BMI:     BP:(!) 146/91  HR:(!) 59bpm  TEMP: ( )  RESP:  Physical Exam Vitals and nursing note reviewed.  HENT:     Head: Normocephalic and atraumatic.     Right Ear: External ear normal.     Left Ear: External ear normal.     Nose: Nose normal.     Mouth/Throat:     Mouth: Mucous membranes are moist.  Eyes:     Extraocular Movements: Extraocular movements intact.  Cardiovascular:     Rate and Rhythm: Normal rate.     Pulses: Normal pulses.   Pulmonary:     Effort: Pulmonary effort is normal.  Abdominal:     General: Abdomen is flat. There is no distension.  Musculoskeletal:        General: Tenderness present.     Cervical back: Normal range of motion.     Comments: Full range of motion, crepitus noted. Mild effusion on the left. No tenderness noted to medial/lateral joint lines. Walks independently, gait steady.    Skin:    General: Skin is warm and dry.     Capillary Refill: Capillary refill takes less than 2 seconds.  Neurological:     General: No focal deficit present.     Mental Status: She is alert and oriented to person, place, and time.  Psychiatric:        Mood and Affect: Mood normal.        Behavior: Behavior normal.     Ortho Exam  Imaging: No results found.  Past Medical/Family/Surgical/Social History: Medications & Allergies reviewed per EMR, new medications updated. 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   Abnormal uterine bleeding 07/06/2021   Morbid obesity ([place]) 11/19/2019   Polyarthralgia 11/19/2019   T2DM (type 2 diabetes  mellitus) ([place]) 11/19/2019   Left knee pain 02/08/2019   Essential hypertension 02/08/2019   Gastroesophageal reflux disease 02/08/2019   Frequent headaches 02/08/2019   Iron deficiency anemia 03/27/2017   Past Medical History:  Diagnosis Date   Anemia    Frequent headaches    GERD (gastroesophageal reflux disease)    Hypertension    Family History  Problem Relation Age of Onset   Hypertension Mother    Hypertension Father    Past Surgical History:  Procedure Laterality Date   CESAREAN SECTION     Social History   Occupational History   Not on file  Tobacco Use   Smoking status: Never   Smokeless tobacco: Never  Vaping Use   Vaping Use: Never used  Substance and Sexual Activity   Alcohol use: Yes    Comment: occ   Drug use: Never   Sexual activity: Yes    Partners: Male    Birth control/protection: Condom

## 2022-06-21 NOTE — Assessment & Plan Note (Signed)
At goal today on amlodipine 10 mg daily.

## 2022-06-21 NOTE — Progress Notes (Unsigned)
Numeric Pain Rating Scale and Functional Assessment Average Pain 0   In the last MONTH (on 0-10 scale) has pain interfered with the following?  1. General activity like being  able to carry out your everyday physical activities such as walking, climbing stairs, carrying groceries, or moving a chair?  Rating(5)     Bilateral knee pain. Standing and walking makes pain worse. Got a cortisone shot 06/10/22

## 2022-06-21 NOTE — Progress Notes (Signed)
   Linda [name] is a 41 y.o. female who presents today for an office visit.  Assessment/Plan:  Chronic Problems Addressed Today: T2DM (type 2 diabetes mellitus) ([place]) Do not see much better controlled 5.6.  She is doing well on Mounjaro with moderate GI side effects that seem to be manageable.  She is having good results with weight loss as well.  We will increase her dose to 7.5 mg weekly.  We can escalate the dose monthly as tolerated.  She will send me a message in a few weeks via MyChart and we can up the dose as needed.  We can recheck A1c in 3 to 6 months.  Morbid obesity ([place]) She is down about 14 pounds since her last visit.  Congratulated patient on weight loss.  We will be increasing her Mounjaro to 7.5 mg weekly as above and she will follow-up in a couple of weeks via MyChart we can titrate the dose as needed.  Iron deficiency anemia On iron supplementation.  We will recheck CBC and iron panel with next blood draw.  Essential hypertension At goal today on amlodipine 10 mg daily.     Subjective:  HPI:  See A/p for status of chronic conditions.         Objective:  Physical Exam: BP 125/80   Pulse 74   Temp 98 F (36.7 C) (Temporal)   Ht 5' 6.5" (1.689 m)   Wt (!) 300 lb 12.8 oz (136.4 kg)   BMI 47.82 kg/m   Gen: No acute distress, resting comfortably CV: Regular rate and rhythm with no murmurs appreciated Pulm: Normal work of breathing, clear to auscultation bilaterally with no crackles, wheezes, or rhonchi Neuro: Grossly normal, moves all extremities Psych: Normal affect and thought content      Kellie Murrill M. Jerline Pain, MD 06/21/2022 9:57 AM

## 2022-06-21 NOTE — Assessment & Plan Note (Signed)
Do not see much better controlled 5.6.  She is doing well on Mounjaro with moderate GI side effects that seem to be manageable.  She is having good results with weight loss as well.  We will increase her dose to 7.5 mg weekly.  We can escalate the dose monthly as tolerated.  She will send me a message in a few weeks via MyChart and we can up the dose as needed.  We can recheck A1c in 3 to 6 months.

## 2022-06-21 NOTE — Assessment & Plan Note (Signed)
She is down about 14 pounds since her last visit.  Congratulated patient on weight loss.  We will be increasing her Mounjaro to 7.5 mg weekly as above and she will follow-up in a couple of weeks via MyChart we can titrate the dose as needed.

## 2022-06-27 ENCOUNTER — Other Ambulatory Visit (HOSPITAL_BASED_OUTPATIENT_CLINIC_OR_DEPARTMENT_OTHER): Payer: Self-pay

## 2022-07-03 ENCOUNTER — Other Ambulatory Visit (HOSPITAL_BASED_OUTPATIENT_CLINIC_OR_DEPARTMENT_OTHER): Payer: Self-pay

## 2022-07-04 ENCOUNTER — Ambulatory Visit (INDEPENDENT_AMBULATORY_CARE_PROVIDER_SITE_OTHER): Payer: 59 | Admitting: Physical Medicine and Rehabilitation

## 2022-07-04 ENCOUNTER — Ambulatory Visit: Payer: Self-pay

## 2022-07-04 DIAGNOSIS — M25562 Pain in left knee: Secondary | ICD-10-CM | POA: Diagnosis not present

## 2022-07-04 DIAGNOSIS — M2242 Chondromalacia patellae, left knee: Secondary | ICD-10-CM

## 2022-07-04 DIAGNOSIS — G8929 Other chronic pain: Secondary | ICD-10-CM

## 2022-07-04 NOTE — Progress Notes (Addendum)
Linda [name] - 41 y.o. female MRN [id number]  Date of birth: 1981-01-06  Office Visit Note: Visit Date: 07/04/2022 PCP: Vivi Barrack, MD Referred by: Vivi Barrack, MD  Subjective: Chief Complaint  Patient presents with   Left Knee - Pain   HPI:  Linda [name] is a 41 y.o. female who comes in today at the request of Barnet Pall, FNP for planned Left  Genicular nerve block block with fluoroscopic guidance.  I had a long discussion today with the patient concerning the timing on injections which are diagnostic blocks and as far as how much pain she was having in the knee at this time.  We also talked about a change in insurance coming up and decided to postpone the injection to a point later own particularly if she was having more pain and had some clarification on new insurance after the first of the year.  She has been followed in the past by Dr. Anderson Malta and Dr. Lynne Leader.  She will continue with Tylenol.  She has had anti-inflammatories in the past.  We discussed the procedure in detail and did explain the 2 test injections prior to her ablation.  **ADDENDUM this is an addendum to the original note that detailed the procedure itself.  I had inadvertently dictated the procedure note ahead of time as we do when the patient is here for a planned injection.  As noted above we did not complete the injection today.   Review of Systems  Musculoskeletal:  Positive for joint pain.  All other systems reviewed and are negative.  Otherwise per HPI.  Assessment & Plan: Visit Diagnoses:    ICD-10-CM   1. Chronic pain of left knee  M25.562 Large Joint Inj: L knee   G89.29 bupivacaine (MARCAINE) 0.5 % (with pres) injection 5 mL    CANCELED: XR C-ARM NO REPORT    2. Chondromalacia patellae, left knee  M22.42 Large Joint Inj: L knee    bupivacaine (MARCAINE) 0.5 % (with pres) injection 5 mL    CANCELED: XR C-ARM NO REPORT      Plan: Findings:  See HPI.     Meds & Orders:  Meds ordered this encounter  Medications   bupivacaine (MARCAINE) 0.5 % (with pres) injection 5 mL    Orders Placed This Encounter  Procedures   Large Joint Inj: L knee    Follow-up: Return if symptoms worsen or fail to improve.   Procedures: No procedures performed      Clinical History: EXAM: MRI OF THE LEFT KNEE WITHOUT CONTRAST   TECHNIQUE: Multiplanar, multisequence MR imaging of the knee was performed. No intravenous contrast was administered.   COMPARISON:  None.   FINDINGS: MENISCI   Medial: Small radial tear of the free edge of the posterior horn of the medial meniscus.   Lateral: Intact.   LIGAMENTS   Cruciates: ACL and PCL are intact.   Collaterals: Medial collateral ligament is intact. Lateral collateral ligament complex is intact.   CARTILAGE   Patellofemoral: Partial-thickness cartilage loss of the lateral patellar facet and patellar apex with subchondral reactive marrow edema.   Medial: Mild partial-thickness cartilage loss of the medial femorotibial compartment.   Lateral:  No chondral defect.   JOINT: Small joint effusion. Mild edema in superolateral Hoffa's. No plical thickening.   POPLITEAL FOSSA: Popliteus tendon is intact. Tiny Baker's cyst.   EXTENSOR MECHANISM: Intact quadriceps tendon. Intact patellar tendon. Intact lateral patellar retinaculum. Intact medial patellar retinaculum. Intact  MPFL.   BONES: No aggressive osseous lesion. No fracture or dislocation.   Other: No fluid collection or hematoma. Muscles are normal.   IMPRESSION: 1. Small radial tear of the free edge of the posterior horn of the medial meniscus. 2. Partial-thickness cartilage loss of the lateral patellar facet and patellar apex with subchondral reactive marrow edema. 3. Mild partial-thickness cartilage loss of the medial femorotibial compartment.     Electronically Signed   By: Kathreen Devoid   On: 11/12/2020 09:11      Objective:  VS:  HT:    WT:   BMI:     BP:   HR: bpm  TEMP: ( )  RESP:  Physical Exam Vitals and nursing note reviewed.  Constitutional:      General: She is not in acute distress.    Appearance: Normal appearance. She is well-developed. She is obese. She is not ill-appearing.  HENT:     Head: Normocephalic and atraumatic.  Eyes:     Conjunctiva/sclera: Conjunctivae normal.     Pupils: Pupils are equal, round, and reactive to light.  Cardiovascular:     Rate and Rhythm: Normal rate.     Pulses: Normal pulses.  Pulmonary:     Effort: Pulmonary effort is normal.  Musculoskeletal:     Right lower leg: No edema.     Left lower leg: No edema.     Comments: Patient has good distal strength and ambulates without aid.  She has an antalgic gait to the left.  She has some joint line tenderness to palpation.  Skin:    General: Skin is warm and dry.     Findings: No erythema or rash.  Neurological:     General: No focal deficit present.     Mental Status: She is alert and oriented to person, place, and time.     Sensory: No sensory deficit.     Motor: No abnormal muscle tone.     Coordination: Coordination normal.     Gait: Gait normal.  Psychiatric:        Mood and Affect: Mood normal.        Behavior: Behavior normal.      Imaging: No results found.

## 2022-07-16 ENCOUNTER — Encounter: Payer: Self-pay | Admitting: Physical Medicine and Rehabilitation

## 2022-07-16 MED ORDER — BUPIVACAINE HCL 0.5 % IJ SOLN: 5.0000 mL | INTRAMUSCULAR | Status: DC | PRN

## 2022-07-16 NOTE — Addendum Note (Signed)
Addended by: Raymondo Band on: 07/16/2022 03:49 PM   Modules accepted: Level of Service

## 2022-07-25 ENCOUNTER — Telehealth: Payer: Self-pay | Admitting: Physical Medicine and Rehabilitation

## 2022-07-25 NOTE — Telephone Encounter (Signed)
Patient states she was supposed to have a procedure done to her knee that was put off she is ready to schedule

## 2022-07-26 NOTE — Telephone Encounter (Signed)
Spoke with patient and scheduled for 08/07/22

## 2022-08-06 ENCOUNTER — Encounter: Payer: Self-pay | Admitting: Family Medicine

## 2022-08-07 ENCOUNTER — Ambulatory Visit (INDEPENDENT_AMBULATORY_CARE_PROVIDER_SITE_OTHER): Payer: 59 | Admitting: Physical Medicine and Rehabilitation

## 2022-08-07 VITALS — BP 125/83 | HR 90

## 2022-08-07 DIAGNOSIS — M25562 Pain in left knee: Secondary | ICD-10-CM

## 2022-08-07 DIAGNOSIS — G8929 Other chronic pain: Secondary | ICD-10-CM

## 2022-08-07 DIAGNOSIS — M2242 Chondromalacia patellae, left knee: Secondary | ICD-10-CM

## 2022-08-07 NOTE — Telephone Encounter (Signed)
Please advise 

## 2022-08-07 NOTE — Progress Notes (Signed)
No charge visit today.  Spoke with patient briefly that we really need to do this diagnostic injection as a test.  She reports most of her symptoms at work on her feet.  We need to do the injection prior to her going to work.  Will reschedule the injection for that time period.   Numeric Pain Rating Scale and Functional Assessment Average Pain 0   In the last MONTH (on 0-10 scale) has pain interfered with the following?  1. General activity like being  able to carry out your everyday physical activities such as walking, climbing stairs, carrying groceries, or moving a chair?  Rating(4)   +Driver, -BT, -Dye Allergies.  Standing and walking for long periods of time makes pain worse

## 2022-08-07 NOTE — Telephone Encounter (Signed)
Ok to increase to '10mg'$  weekly. Would like for her to follow-up with [place] in a few weeks.  Algis Greenhouse. Jerline Pain, MD 08/07/2022 10:48 AM

## 2022-08-13 ENCOUNTER — Other Ambulatory Visit: Payer: Self-pay | Admitting: *Deleted

## 2022-08-13 ENCOUNTER — Other Ambulatory Visit (HOSPITAL_BASED_OUTPATIENT_CLINIC_OR_DEPARTMENT_OTHER): Payer: Self-pay

## 2022-08-13 MED ORDER — TIRZEPATIDE 10 MG/0.5ML ~~LOC~~ SOAJ
10.0000 mg | SUBCUTANEOUS | 1 refills | Status: DC
  Filled 2022-08-13: qty 2, 28d supply, fill #0

## 2022-08-14 NOTE — Telephone Encounter (Signed)
Spoke with patient on 08/13/2022  Rx Mounjaro send to pharmacy

## 2022-08-27 ENCOUNTER — Ambulatory Visit: Payer: Self-pay

## 2022-08-27 ENCOUNTER — Ambulatory Visit (INDEPENDENT_AMBULATORY_CARE_PROVIDER_SITE_OTHER): Payer: 59 | Admitting: Physical Medicine and Rehabilitation

## 2022-08-27 ENCOUNTER — Encounter: Payer: Self-pay | Admitting: Physical Medicine and Rehabilitation

## 2022-08-27 VITALS — BP 150/103 | HR 83

## 2022-08-27 DIAGNOSIS — M25562 Pain in left knee: Secondary | ICD-10-CM

## 2022-08-27 MED ORDER — BUPIVACAINE HCL 0.5 % IJ SOLN
5.0000 mL | INTRAMUSCULAR | Status: AC | PRN
  Administered 2022-08-27: 5 mL

## 2022-08-27 NOTE — Progress Notes (Signed)
Functional Pain Scale - descriptive words and definitions  Moderate (4)   Constantly aware of pain, can complete ADLs with modification/sleep marginally affected at times/passive distraction is of no use, but active distraction gives some relief. Moderate range order  Average Pain 9   +Driver- no driver, -BT, -Dye Allergies.  Left knee pain. Pain when standing for long periods of time

## 2022-08-27 NOTE — Patient Instructions (Signed)

## 2022-08-27 NOTE — Progress Notes (Signed)
Linda [name] - 42 y.o. female MRN [id number]  Date of birth: [birth year]  Office Visit Note: Visit Date: 08/27/2022 PCP: Vivi Barrack, MD Referred by: Vivi Barrack, MD  Subjective: Chief Complaint  Patient presents with   Left Knee - Pain   HPI:  Linda [name] is a 42 y.o. female who comes in today at the request of Barnet Pall, FNP and Dr. Lynne Leader for planned Left  Genicular nerve block block with fluoroscopic guidance.  The patient has failed conservative care including injections, home exercise, medications, time and activity modification.  Nerve block will be performed in a double block paradigm with goal towards radiofrequency ablation of the same genicular nerves for longer term definitive care.  Please see requesting physician notes for further details and justification.    ROS Otherwise per HPI.  Assessment & Plan: Visit Diagnoses:    ICD-10-CM   1. Left knee pain, unspecified chronicity  M25.562 XR C-ARM NO REPORT      Plan: No additional findings.   Meds & Orders: No orders of the defined types were placed in this encounter.   Orders Placed This Encounter  Procedures   Large Joint Inj   XR C-ARM NO REPORT    Follow-up: Return for Review Pain Diary.   Procedures: Left genicular knee block with fluoroscopy on 08/27/2022 8:45 AM Indications: pain and diagnostic evaluation Details: 25 G 3.5 in needle, fluoroscopy-guided anterior approach Medications: 5 mL bupivacaine 0.5 % Outcome: tolerated well, no immediate complications  Genicular Nerve Blockade with Fluoroscopic Guidance  [name]      Date of Birth: October 26, 1980 MRN: [id number] PCP: Vivi Barrack, MD      Visit Date: '@ENCDATE'$ @   Universal Protocol:    Date/Time: 01/02/246:21 PM  Consent Given By: the patient  Position: SUPINE  Additional Comments: Vital signs were monitored before and after the procedure. Patient was prepped and draped in the  usual sterile fashion. The correct patient, procedure, and site was verified.   Injection Procedure Details:  Procedure Site One Meds Administered: No orders of the defined types were placed in this encounter.    Laterality: Left  Location/Site:  Superior medial, superior lateral and inferior medial genicular nerve  Needle size: 3.5 inch 25-gauge spinal needle  Needle Placement: Just posterior to midline on the lateral view at the locations noted in the procedure note.   Findings:    -Comments: Excellent position of the needle tip using biplanar imaging with low contrast showing no vascular flow.  Procedure Details: The fluoroscope was positioned in a 90 degree anteroposterior (AP) fluoroscopic view and then tilted to obtain a true AP view of the knee joint.  The target areas are the mid-point of the curve formed where the femoral shaft and epicondyles meet as well as the midpoint of the curve on the medial side where the tibial shaft meets the plateau.  Using fluoroscopic guidance the target areas were infiltrated using a 27-gauge needle and 1-2 mL of 1% lidocaine without epinephrine to provide local anesthetic.  Then, for each target area a 3-1/2 inch 25-gauge spinal needle was driven down under intermittent fluoroscopic guidance to the periosteum of the target site.  Once all 3 needles were in place in the AP view, a lateral view was obtained at each needle was driven control to the midpoint of the shafts respectively.  A spot film was obtained of the lateral view and saved.  Next, an AP view was obtained once again  to confirm placement.  A spot film then was obtained and saved.  After fluoroscopic imaging was used to determine and confirm placement, 0.5-1 mL of injectate was delivered at each target location.  The patient was given a pain diary to complete and return to the office.      Additional Comments:  The patient tolerated the procedure well Dressing: Band-Aid     Post-procedure details: Patient was observed during the procedure. Post-procedure instructions were reviewed.  Patient left the clinic in stable condition.         Consent was given by the patient. Immediately prior to procedure a time out was called to verify the correct patient, procedure, equipment, support staff and site/side marked as required. Patient was prepped and draped in the usual sterile fashion.          Clinical History: EXAM: MRI OF THE LEFT KNEE WITHOUT CONTRAST   TECHNIQUE: Multiplanar, multisequence MR imaging of the knee was performed. No intravenous contrast was administered.   COMPARISON:  None.   FINDINGS: MENISCI   Medial: Small radial tear of the free edge of the posterior horn of the medial meniscus.   Lateral: Intact.   LIGAMENTS   Cruciates: ACL and PCL are intact.   Collaterals: Medial collateral ligament is intact. Lateral collateral ligament complex is intact.   CARTILAGE   Patellofemoral: Partial-thickness cartilage loss of the lateral patellar facet and patellar apex with subchondral reactive marrow edema.   Medial: Mild partial-thickness cartilage loss of the medial femorotibial compartment.   Lateral:  No chondral defect.   JOINT: Small joint effusion. Mild edema in superolateral Hoffa's. No plical thickening.   POPLITEAL FOSSA: Popliteus tendon is intact. Tiny Baker's cyst.   EXTENSOR MECHANISM: Intact quadriceps tendon. Intact patellar tendon. Intact lateral patellar retinaculum. Intact medial patellar retinaculum. Intact MPFL.   BONES: No aggressive osseous lesion. No fracture or dislocation.   Other: No fluid collection or hematoma. Muscles are normal.   IMPRESSION: 1. Small radial tear of the free edge of the posterior horn of the medial meniscus. 2. Partial-thickness cartilage loss of the lateral patellar facet and patellar apex with subchondral reactive marrow edema. 3. Mild partial-thickness  cartilage loss of the medial femorotibial compartment.     Electronically Signed   By: Kathreen Devoid   On: 11/12/2020 09:11     Objective:  VS:  HT:    WT:   BMI:     BP:(!) 150/103  HR:83bpm  TEMP: ( )  RESP:  Physical Exam   Imaging: XR C-ARM NO REPORT  Result Date: 08/27/2022 Please see Notes tab for imaging impression.

## 2022-08-28 ENCOUNTER — Other Ambulatory Visit: Payer: Self-pay | Admitting: Physical Medicine and Rehabilitation

## 2022-08-28 DIAGNOSIS — G8929 Other chronic pain: Secondary | ICD-10-CM

## 2022-08-28 NOTE — Progress Notes (Signed)
Per pain diary, she reports 100% relief of pain with recent left genicular nerve block. We will proceed with second diagnostic block.

## 2022-09-04 ENCOUNTER — Ambulatory Visit: Payer: Self-pay

## 2022-09-04 ENCOUNTER — Ambulatory Visit (INDEPENDENT_AMBULATORY_CARE_PROVIDER_SITE_OTHER): Payer: 59 | Admitting: Physical Medicine and Rehabilitation

## 2022-09-04 ENCOUNTER — Encounter: Payer: Self-pay | Admitting: Physical Medicine and Rehabilitation

## 2022-09-04 DIAGNOSIS — G8929 Other chronic pain: Secondary | ICD-10-CM | POA: Diagnosis not present

## 2022-09-04 DIAGNOSIS — M25562 Pain in left knee: Secondary | ICD-10-CM | POA: Diagnosis not present

## 2022-09-04 NOTE — Progress Notes (Signed)
Functional Pain Scale - descriptive words and definitions  Intense (8)    Cannot complete any ADLs without much assistance/cannot concentrate/conversation is difficult/unable to sleep and unable to use distraction. Severe range order  Average Pain 8   +Driver, -BT, -Dye Allergies.  Left knee pain. Had relief with last injection during 4 hours

## 2022-09-04 NOTE — Progress Notes (Signed)
Linda [name] - 42 y.o. female MRN [id number]  Date of birth: [birth year]  Office Visit Note: Visit Date: 09/04/2022 PCP: Vivi Barrack, MD Referred by: Vivi Barrack, MD  Subjective: Chief Complaint  Patient presents with   Left Knee - Pain   HPI:  Linda [name] is a 42 y.o. female who comes in today at the request of Barnet Pall, FNP and Dr. Lynne Leader for planned Left  Genicular nerve block block with fluoroscopic guidance.  The patient has failed conservative care including injections, home exercise, medications, time and activity modification.  Nerve block is 2nd in a double block paradigm with goal towards radiofrequency ablation of the same genicular nerves for longer term definitive care.  Please see requesting physician notes for further details and justification.    ROS Otherwise per HPI.  Assessment & Plan: Visit Diagnoses:    ICD-10-CM   1. Chronic pain of left knee  M25.562 XR C-ARM NO REPORT   G89.29 Large Joint Inj: L knee      Plan: No additional findings.   Meds & Orders: No orders of the defined types were placed in this encounter.   Orders Placed This Encounter  Procedures   Large Joint Inj: L knee   XR C-ARM NO REPORT    Follow-up: No follow-ups on file.   Procedures: Large Joint Inj: L knee on 09/04/2022 4:06 PM Indications: pain and diagnostic evaluation Details: 25 G 3.5 in needle, fluoroscopy-guided anterior approach Medications: 5 mL bupivacaine 0.5 % Outcome: tolerated well, no immediate complications  Genicular Nerve Blockade with Fluoroscopic Guidance  [name]      Date of Birth: 14-Aug-1981 MRN: [id number] PCP: Vivi Barrack, MD      Visit Date: '@ENCDATE'$ @   Universal Protocol:    Date/Time: 01/10/244:06 PM  Consent Given By: the patient  Position: SUPINE  Additional Comments: Vital signs were monitored before and after the procedure. Patient was prepped and draped in the usual  sterile fashion. The correct patient, procedure, and site was verified.   Injection Procedure Details:  Procedure Site One Meds Administered: No orders of the defined types were placed in this encounter.    Laterality: Left  Location/Site:  Superior medial, superior lateral and inferior medial genicular nerve  Needle size: 3.5 inch 25-gauge spinal needle  Needle Placement: Just posterior to midline on the lateral view at the locations noted in the procedure note.   Findings:    -Comments: Excellent position of the needle tip using biplanar imaging with low contrast showing no vascular flow.  Procedure Details: The fluoroscope was positioned in a 90 degree anteroposterior (AP) fluoroscopic view and then tilted to obtain a true AP view of the knee joint.  The target areas are the mid-point of the curve formed where the femoral shaft and epicondyles meet as well as the midpoint of the curve on the medial side where the tibial shaft meets the plateau.  Using fluoroscopic guidance the target areas were infiltrated using a 27-gauge needle and 1-2 mL of 1% lidocaine without epinephrine to provide local anesthetic.  Then, for each target area a 3-1/2 inch 25-gauge spinal needle was driven down under intermittent fluoroscopic guidance to the periosteum of the target site.  Once all 3 needles were in place in the AP view, a lateral view was obtained at each needle was driven control to the midpoint of the shafts respectively.  A spot film was obtained of the lateral view and saved.  Next,  an AP view was obtained once again to confirm placement.  A spot film then was obtained and saved.  After fluoroscopic imaging was used to determine and confirm placement, 0.5-1 mL of injectate was delivered at each target location.  The patient was given a pain diary to complete and return to the office.      Additional Comments:  The patient tolerated the procedure well Dressing: Band-Aid     Post-procedure details: Patient was observed during the procedure. Post-procedure instructions were reviewed.  Patient left the clinic in stable condition.         Consent was given by the patient. Immediately prior to procedure a time out was called to verify the correct patient, procedure, equipment, support staff and site/side marked as required. Patient was prepped and draped in the usual sterile fashion.          Clinical History: EXAM: MRI OF THE LEFT KNEE WITHOUT CONTRAST   TECHNIQUE: Multiplanar, multisequence MR imaging of the knee was performed. No intravenous contrast was administered.   COMPARISON:  None.   FINDINGS: MENISCI   Medial: Small radial tear of the free edge of the posterior horn of the medial meniscus.   Lateral: Intact.   LIGAMENTS   Cruciates: ACL and PCL are intact.   Collaterals: Medial collateral ligament is intact. Lateral collateral ligament complex is intact.   CARTILAGE   Patellofemoral: Partial-thickness cartilage loss of the lateral patellar facet and patellar apex with subchondral reactive marrow edema.   Medial: Mild partial-thickness cartilage loss of the medial femorotibial compartment.   Lateral:  No chondral defect.   JOINT: Small joint effusion. Mild edema in superolateral Hoffa's. No plical thickening.   POPLITEAL FOSSA: Popliteus tendon is intact. Tiny Baker's cyst.   EXTENSOR MECHANISM: Intact quadriceps tendon. Intact patellar tendon. Intact lateral patellar retinaculum. Intact medial patellar retinaculum. Intact MPFL.   BONES: No aggressive osseous lesion. No fracture or dislocation.   Other: No fluid collection or hematoma. Muscles are normal.   IMPRESSION: 1. Small radial tear of the free edge of the posterior horn of the medial meniscus. 2. Partial-thickness cartilage loss of the lateral patellar facet and patellar apex with subchondral reactive marrow edema. 3. Mild partial-thickness  cartilage loss of the medial femorotibial compartment.     Electronically Signed   By: Kathreen Devoid   On: 11/12/2020 09:11     Objective:  VS:  HT:    WT:   BMI:     BP:   HR: bpm  TEMP: ( )  RESP:  Physical Exam   Imaging: No results found.

## 2022-09-04 NOTE — Patient Instructions (Signed)

## 2022-09-05 ENCOUNTER — Other Ambulatory Visit (HOSPITAL_BASED_OUTPATIENT_CLINIC_OR_DEPARTMENT_OTHER): Payer: Self-pay

## 2022-09-05 ENCOUNTER — Telehealth: Payer: Self-pay

## 2022-09-05 ENCOUNTER — Other Ambulatory Visit: Payer: Self-pay | Admitting: Physical Medicine and Rehabilitation

## 2022-09-05 DIAGNOSIS — G8929 Other chronic pain: Secondary | ICD-10-CM

## 2022-09-05 MED ORDER — DIAZEPAM 5 MG PO TABS
ORAL_TABLET | ORAL | 0 refills | Status: DC
  Filled 2022-09-05: qty 2, 1d supply, fill #0

## 2022-09-05 NOTE — Telephone Encounter (Signed)
Patient is calling because she has questions concerning the RFA

## 2022-09-05 NOTE — Telephone Encounter (Signed)
ERROR

## 2022-09-05 NOTE — Progress Notes (Signed)
Per pain diary, greater than 80% relief of pain with second left genicular nerve block. We will proceed with radiofrequency ablation. I also placed prescription for pre-procedure Valium.

## 2022-09-06 ENCOUNTER — Encounter: Payer: Self-pay | Admitting: Family Medicine

## 2022-09-09 ENCOUNTER — Ambulatory Visit: Payer: 59 | Admitting: Family Medicine

## 2022-09-10 ENCOUNTER — Other Ambulatory Visit: Payer: Self-pay | Admitting: *Deleted

## 2022-09-10 ENCOUNTER — Other Ambulatory Visit (HOSPITAL_BASED_OUTPATIENT_CLINIC_OR_DEPARTMENT_OTHER): Payer: Self-pay

## 2022-09-10 MED ORDER — TIRZEPATIDE 12.5 MG/0.5ML ~~LOC~~ SOAJ
12.5000 mg | SUBCUTANEOUS | 1 refills | Status: DC
  Filled 2022-09-10: qty 2, 28d supply, fill #0

## 2022-09-10 NOTE — Telephone Encounter (Signed)
Ok to send in new rx for 12.'5mg'$  weekly.  She should follow up with [place] in a few weeks to let us know how this is working.  Algis Greenhouse. Jerline Pain, MD 09/10/2022 9:51 AM

## 2022-09-15 MED ORDER — BUPIVACAINE HCL 0.5 % IJ SOLN
5.0000 mL | INTRAMUSCULAR | Status: AC | PRN
  Administered 2022-09-04: 5 mL via INTRA_ARTICULAR

## 2022-09-20 ENCOUNTER — Ambulatory Visit (INDEPENDENT_AMBULATORY_CARE_PROVIDER_SITE_OTHER): Payer: 59 | Admitting: Family Medicine

## 2022-09-20 ENCOUNTER — Encounter: Payer: Self-pay | Admitting: Family Medicine

## 2022-09-20 ENCOUNTER — Other Ambulatory Visit (HOSPITAL_BASED_OUTPATIENT_CLINIC_OR_DEPARTMENT_OTHER): Payer: Self-pay

## 2022-09-20 VITALS — BP 119/77 | HR 72 | Temp 98.7°F | Ht 66.5 in | Wt 278.8 lb

## 2022-09-20 DIAGNOSIS — E1165 Type 2 diabetes mellitus with hyperglycemia: Secondary | ICD-10-CM

## 2022-09-20 DIAGNOSIS — D509 Iron deficiency anemia, unspecified: Secondary | ICD-10-CM | POA: Diagnosis not present

## 2022-09-20 DIAGNOSIS — I10 Essential (primary) hypertension: Secondary | ICD-10-CM | POA: Diagnosis not present

## 2022-09-20 DIAGNOSIS — M2550 Pain in unspecified joint: Secondary | ICD-10-CM | POA: Diagnosis not present

## 2022-09-20 LAB — CBC
HCT: 38.6 % (ref 36.0–46.0)
Hemoglobin: 12.6 g/dL (ref 12.0–15.0)
MCHC: 32.7 g/dL (ref 30.0–36.0)
MCV: 79.4 fl (ref 78.0–100.0)
Platelets: 307 10*3/uL (ref 150.0–400.0)
RBC: 4.86 Mil/uL (ref 3.87–5.11)
RDW: 16.8 % — ABNORMAL HIGH (ref 11.5–15.5)
WBC: 4.9 10*3/uL (ref 4.0–10.5)

## 2022-09-20 LAB — IBC + FERRITIN
Ferritin: 29.4 ng/mL (ref 10.0–291.0)
Iron: 33 ug/dL — ABNORMAL LOW (ref 42–145)
Saturation Ratios: 9.3 % — ABNORMAL LOW (ref 20.0–50.0)
TIBC: 355.6 ug/dL (ref 250.0–450.0)
Transferrin: 254 mg/dL (ref 212.0–360.0)

## 2022-09-20 LAB — POCT GLYCOSYLATED HEMOGLOBIN (HGB A1C): Hemoglobin A1C: 5.2 % (ref 4.0–5.6)

## 2022-09-20 LAB — MICROALBUMIN / CREATININE URINE RATIO
Creatinine,U: 418.4 mg/dL
Microalb Creat Ratio: 0.5 mg/g (ref 0.0–30.0)
Microalb, Ur: 1.9 mg/dL (ref 0.0–1.9)

## 2022-09-20 MED ORDER — KETOROLAC TROMETHAMINE 10 MG PO TABS
10.0000 mg | ORAL_TABLET | Freq: Four times a day (QID) | ORAL | 0 refills | Status: DC | PRN
  Filled 2022-09-20: qty 20, 5d supply, fill #0

## 2022-09-20 MED ORDER — KETOROLAC TROMETHAMINE 60 MG/2ML IM SOLN
60.0000 mg | Freq: Once | INTRAMUSCULAR | Status: AC
  Administered 2022-09-20: 60 mg via INTRAMUSCULAR

## 2022-09-20 NOTE — Assessment & Plan Note (Signed)
A1c is well-controlled at 5.2.  Tolerating Mounjaro well with minimal side effects.  She will continue current dose 12.5 mg weekly for another few weeks.  She will send me a message in a few weeks via MyChart and we can titrate her dose as needed.  She will come back in 6 months for CPE and we will recheck A1c at that time.

## 2022-09-20 NOTE — Assessment & Plan Note (Signed)
On iron supplementation.  Check CBC and iron panel today.

## 2022-09-20 NOTE — Assessment & Plan Note (Signed)
Following with orthopedics and sports medicine for this.  She does have upcoming ablation however still is having quite a bit of pain.  We discussed treatment options including another round of prednisone versus NSAIDs.  Will avoid prednisone at this point due to concern for side effects.  She did not have much success with meloxicam or Celebrex in the past.  Will give 60 mg of Toradol IM today and she can start by mouth tomorrow.  Hopefully will have continued improvement with her ablation as well as weight loss.

## 2022-09-20 NOTE — Progress Notes (Signed)
   Linda Linda [name] is a 42 y.o. female who presents today for an office visit.  Assessment/Plan:  Chronic Problems Addressed Today: T2DM (type 2 diabetes mellitus) (HCC) A1c is well-controlled at 5.2.  Tolerating Mounjaro well with minimal side effects.  She will continue current dose 12.5 mg weekly for another few weeks.  She will send me a message in a few weeks via MyChart and we can titrate her dose as needed.  She will come back in 6 months for CPE and we will recheck A1c at that time.  Polyarthralgia Following with orthopedics and sports medicine for this.  She does have upcoming ablation however still is having quite a bit of Linda [name].  We discussed treatment options including another round of prednisone versus NSAIDs.  Will avoid prednisone at this point due to concern for side effects.  She did not have much success with meloxicam or Celebrex in the past.  Will give 60 mg of Toradol IM today and she can start by mouth tomorrow.  Hopefully will have continued improvement with her ablation as well as weight loss.  Iron deficiency anemia On iron supplementation.  Check CBC and iron panel today.     Subjective:  HPI:  See A/P for status of chronic conditions.  Patient here today for follow-up.  Last saw her 3 months ago for elevated glucose and Diabetes.  She was doing well at that time with [facility] and we increased the dose to 7.5 mg weekly.  We have increased the dose a few times since her last visit and she is now on 12.5 mg weekly.  Since she has been here last she has been doing well. She has been following with orthopedics for her bilateral knee Linda [name]. She does have an ablation upcoming nerve ablation but does request something to help with her Linda [name] in the interim. She has been on several NSAIDs in the past. Celebrex did not help. Meloxicam help only modestly.        Objective:  Physical Exam: BP 119/77   Pulse 72   Temp 98.7 F (37.1 C) (Temporal)   Ht 5' 6.5" (1.689 m)    Wt 278 lb 12.8 oz (126.5 kg)   SpO2 100%   BMI 44.33 kg/m   Wt Readings from Last 3 Encounters:  09/20/22 278 lb 12.8 oz (126.5 kg)  06/21/22 (!) 300 lb 12.8 oz (136.4 kg)  06/10/22 (!) 302 lb (137 kg)  Gen: No acute distress, resting comfortably Neuro: Grossly normal, moves all extremities Psych: Normal affect and thought content      Linda Linda [name] M. Jerline Pain, MD 09/20/2022 9:25 AM

## 2022-09-20 NOTE — Patient Instructions (Addendum)
It was very nice to see you today!  We will give you an injection of toradol today.  Please start the by mouth pill tomorrow.  We will check blood work today.  You are doing great with your weight loss.  Please send me a message in a few weeks to let me know how you are doing with the [facility].  We will see you back in 6 months for your annual physical.  Come back to see [place] sooner if needed.  Take care, Dr Jerline Pain  PLEASE NOTE:  If you had any lab tests, please let us know if you have not heard back within a few days. You may see your results on mychart before we have a chance to review them but we will give you a call once they are reviewed by [place].   If we ordered any referrals today, please let us know if you have not heard from their office within the next week.   If you had any urgent prescriptions sent in today, please check with the pharmacy within an hour of our visit to make sure the prescription was transmitted appropriately.   Please try these tips to maintain a healthy lifestyle:  Eat at least 3 REAL meals and 1-2 snacks per day.  Aim for no more than 5 hours between eating.  If you eat breakfast, please do so within one hour of getting up.   Each meal should contain half fruits/vegetables, one quarter protein, and one quarter carbs (no bigger than a computer mouse)  Cut down on sweet beverages. This includes juice, soda, and sweet tea.   Drink at least 1 glass of water with each meal and aim for at least 8 glasses per day  Exercise at least 150 minutes every week.

## 2022-09-20 NOTE — Addendum Note (Signed)
Addended by: Betti Cruz on: 09/20/2022 09:31 AM   Modules accepted: Orders

## 2022-09-23 NOTE — Progress Notes (Signed)
Please inform patient of the following:  Her labs are all stable.  Her iron is still on the lower side but better than previous.  She should continue her iron supplementation and we can recheck this again in 6 to 12 months.

## 2022-09-25 ENCOUNTER — Ambulatory Visit: Payer: Self-pay

## 2022-09-25 ENCOUNTER — Ambulatory Visit (INDEPENDENT_AMBULATORY_CARE_PROVIDER_SITE_OTHER): Payer: 59 | Admitting: Physical Medicine and Rehabilitation

## 2022-09-25 VITALS — BP 120/83 | HR 88

## 2022-09-25 DIAGNOSIS — M25562 Pain in left knee: Secondary | ICD-10-CM | POA: Diagnosis not present

## 2022-09-25 DIAGNOSIS — G8929 Other chronic pain: Secondary | ICD-10-CM

## 2022-09-25 MED ORDER — BUPIVACAINE HCL 0.5 % IJ SOLN
5.0000 mL | INTRAMUSCULAR | Status: AC | PRN
  Administered 2022-09-25: 5 mL via INTRA_ARTICULAR

## 2022-09-25 NOTE — Progress Notes (Signed)
Linda [name] - 42 y.o. female MRN [id number]  Date of birth: 04-25-1981  Office Visit Note: Visit Date: 09/25/2022 PCP: Vivi Barrack, MD Referred by: Vivi Barrack, MD  Subjective: Chief Complaint  Patient presents with   Left Knee - Pain   HPI:  Linda [name] is a 42 y.o. female who comes in todayfor planned radiofrequency ablation of the Left knee/genicular nerve branches.  Patient has had double diagnostic blocks of the superior medial, superior lateral and inferior medial genicular nerves with more than 50% relief.  These are documented on pain diary.  They have had Left knee pain for quite some time, more than 3 months, which has been an ongoing situation with recalcitrant right knee pain.  They have no radicular pain and no prior joint replacement.  They have had physical therapy as well as home exercise program. They have had multiple knee injections, both cortisone and Viscosupplementation.  Accordingly they meet all the criteria and qualification for for radiofrequency ablation and we are going to complete this today hopefully for more longer term relief as part of comprehensive management program.    ROS Otherwise per HPI.  Assessment & Plan: Visit Diagnoses:    ICD-10-CM   1. Left knee pain, unspecified chronicity  M25.562 XR C-ARM NO REPORT    2. Chronic pain of right knee  M25.561 Ambulatory referral to Physical Medicine Rehab   [phone]       Plan: No additional findings.   Meds & Orders: No orders of the defined types were placed in this encounter.   Orders Placed This Encounter  Procedures   Large Joint Inj   XR C-ARM NO REPORT   Ambulatory referral to Physical Medicine Rehab    Follow-up: No follow-ups on file.   Procedures: Left genicular radiofrequency ablation on 09/25/2022 3:30 PM Indications: pain and diagnostic evaluation Details: 25 G 3.5 in needle, fluoroscopy-guided anterior approach Medications: 5 mL bupivacaine 0.5  % Outcome: tolerated well, no immediate complications  Genicular Nerve Neurotomy with Fluoroscopic Guidance  [name]      Date of Birth: 1981-07-15 MRN: [id number] PCP: Vivi Barrack, MD      Visit Date:    Universal Protocol:    Date/Time: 01/31/247:41 PM  Consent Given By: the patient  Position: SUPINE  Additional Comments: Vital signs were monitored before and after the procedure. Patient was prepped and draped in the usual sterile fashion. The correct patient, procedure, and site was verified.  Injection Procedure Details:   Procedure diagnoses: Left knee pain, unspecified chronicity [M25.562]    Meds Administered: No orders of the defined types were placed in this encounter.   Laterality: Left  Location/Site:  Knee: respective superior medial, superior lateral and inferior medial genicular nerves  Needle size: 18g, 60m active tip  Needle type: Cosman/[employer] RF Cannula  Findings:   -Comments: Biplanar orthogonal views demonstrated excellent placement of the cannulas along the genicular nerve paths.  Procedure Details: The fluoroscope was positioned in a 90 degree anteroposterior (AP) fluoroscopic view and then tilted to obtain a true AP view of the knee joint.  The target areas for the superior medial, superior lateral and inferior medial genicular nerves are the mid-point of the curve formed where the femoral shaft and epicondyles meet as well as the midpoint of the curve on the medial side where the tibial shaft meets the plateau.  Using fluoroscopic guidance the target areas were infiltrated using a 27-gauge needle and 1-2 mL of  1% lidocaine without epinephrine to provide local anesthetic.  Then, for each target area a radiofrequency cannula was driven down under intermittent fluoroscopic guidance to the periosteum of the target site.  Once all 3 needles were in place in the AP view, a lateral view was obtained and each needle  was driven under control to the midpoint of the shafts respectively.  A spot film was obtained of the lateral view and saved.  Next, an AP view was obtained once again to confirm placement.  A spot film then was obtained and saved.  After fluoroscopic imaging was used to determine and confirm placement, motor stimulation was provided to confirm that there was no inadvertent motor stimulation.  Subsequently, each target area was infiltrated with 1-2 mL of the anesthetizing solution documented above.  After 2 minutes to allow for good anesthesia, a 90 second lesion was performed with the temperature maintained at [place].  This was repeated for each target area.   Additional Comments:  The patient tolerated the procedure well Dressing: band-aid    Post-procedure details: Patient was observed during the procedure. Post-procedure instructions were reviewed. Follow-Up Instructions:  Patient left the clinic in stable condition.   Consent was given by the patient. Immediately prior to procedure a time out was called to verify the correct patient, procedure, equipment, support staff and site/side marked as required. Patient was prepped and draped in the usual sterile fashion.          Clinical History: EXAM: MRI OF THE LEFT KNEE WITHOUT CONTRAST   TECHNIQUE: Multiplanar, multisequence MR imaging of the knee was performed. No intravenous contrast was administered.   COMPARISON:  None.   FINDINGS: MENISCI   Medial: Small radial tear of the free edge of the posterior horn of the medial meniscus.   Lateral: Intact.   LIGAMENTS   Cruciates: ACL and PCL are intact.   Collaterals: Medial collateral ligament is intact. Lateral collateral ligament complex is intact.   CARTILAGE   Patellofemoral: Partial-thickness cartilage loss of the lateral patellar facet and patellar apex with subchondral reactive marrow edema.   Medial: Mild partial-thickness cartilage loss of the  medial femorotibial compartment.   Lateral:  No chondral defect.   JOINT: Small joint effusion. Mild edema in superolateral Hoffa's. No plical thickening.   POPLITEAL FOSSA: Popliteus tendon is intact. Tiny Baker's cyst.   EXTENSOR MECHANISM: Intact quadriceps tendon. Intact patellar tendon. Intact lateral patellar retinaculum. Intact medial patellar retinaculum. Intact MPFL.   BONES: No aggressive osseous lesion. No fracture or dislocation.   Other: No fluid collection or hematoma. Muscles are normal.   IMPRESSION: 1. Small radial tear of the free edge of the posterior horn of the medial meniscus. 2. Partial-thickness cartilage loss of the lateral patellar facet and patellar apex with subchondral reactive marrow edema. 3. Mild partial-thickness cartilage loss of the medial femorotibial compartment.     Electronically Signed   By: Kathreen Devoid   On: 11/12/2020 09:11     Objective:  VS:  HT:    WT:   BMI:     BP:120/83  HR:88bpm  TEMP: ( )  RESP:  Physical Exam Vitals and nursing note reviewed.  Constitutional:      General: She is not in acute distress.    Appearance: Normal appearance. She is obese. She is not ill-appearing.  HENT:     Head: Normocephalic and atraumatic.     Right Ear: External ear normal.     Left Ear: External ear normal.  Eyes:     Extraocular Movements: Extraocular movements intact.  Cardiovascular:     Rate and Rhythm: Normal rate.     Pulses: Normal pulses.  Pulmonary:     Effort: Pulmonary effort is normal. No respiratory distress.  Abdominal:     General: There is no distension.     Palpations: Abdomen is soft.  Musculoskeletal:        General: Tenderness present.     Cervical back: Neck supple.     Right lower leg: No edema.     Left lower leg: No edema.     Comments: Patient has good distal strength with no pain over the greater trochanters.  No clonus or focal weakness.  Skin:    Findings: No erythema, lesion or rash.   Neurological:     General: No focal deficit present.     Mental Status: She is alert and oriented to person, place, and time.     Sensory: No sensory deficit.     Motor: No weakness or abnormal muscle tone.     Coordination: Coordination normal.  Psychiatric:        Mood and Affect: Mood normal.        Behavior: Behavior normal.      Imaging: XR C-ARM NO REPORT  Result Date: 09/25/2022 Please see Notes tab for imaging impression.

## 2022-09-25 NOTE — Progress Notes (Signed)
Functional Pain Scale - descriptive words and definitions   Severe (9)  Cannot do any ADL's even with assistance can barely talk/unable to sleep and unable to use distraction. Severe range order  Average Pain 9   +Driver, -BT, -Dye Allergies.  Left knee pain

## 2022-09-25 NOTE — Patient Instructions (Signed)

## 2022-09-26 NOTE — Progress Notes (Signed)
Her levels are improving. I think she can continue her current dose and we can recheck in 3-6 months.  Algis Greenhouse. Jerline Pain, MD 09/26/2022 1:07 PM

## 2022-10-07 ENCOUNTER — Ambulatory Visit: Payer: Self-pay

## 2022-10-07 ENCOUNTER — Telehealth: Payer: Self-pay

## 2022-10-07 ENCOUNTER — Other Ambulatory Visit (HOSPITAL_BASED_OUTPATIENT_CLINIC_OR_DEPARTMENT_OTHER): Payer: Self-pay

## 2022-10-07 ENCOUNTER — Encounter: Payer: Self-pay | Admitting: Family Medicine

## 2022-10-07 ENCOUNTER — Ambulatory Visit (INDEPENDENT_AMBULATORY_CARE_PROVIDER_SITE_OTHER): Payer: 59 | Admitting: Physical Medicine and Rehabilitation

## 2022-10-07 ENCOUNTER — Other Ambulatory Visit: Payer: Self-pay

## 2022-10-07 ENCOUNTER — Other Ambulatory Visit: Payer: Self-pay | Admitting: Family Medicine

## 2022-10-07 VITALS — BP 142/84 | HR 95

## 2022-10-07 DIAGNOSIS — M25561 Pain in right knee: Secondary | ICD-10-CM

## 2022-10-07 DIAGNOSIS — G8929 Other chronic pain: Secondary | ICD-10-CM

## 2022-10-07 MED ORDER — KETOROLAC TROMETHAMINE 10 MG PO TABS
10.0000 mg | ORAL_TABLET | Freq: Four times a day (QID) | ORAL | 0 refills | Status: DC | PRN
  Filled 2022-10-07: qty 20, 5d supply, fill #0

## 2022-10-07 NOTE — Telephone Encounter (Signed)
Ok to increase dose of mounjaro. Would like for her to follow up with [place] in a few weeks.  Algis Greenhouse. Jerline Pain, MD 10/07/2022 10:21 AM

## 2022-10-07 NOTE — Telephone Encounter (Signed)
OK to refill toradol? Pharmacy states that patient just finished first round end of January.

## 2022-10-07 NOTE — Telephone Encounter (Signed)
The recommendation is for her to not do this more than 5 days at a time. If she has been off it for a few weeks then we can refill but recommend trying to stay off if possible.  Algis Greenhouse. Jerline Pain, MD 10/07/2022 9:20 AM

## 2022-10-07 NOTE — Progress Notes (Signed)
Functional Pain Scale - descriptive words and definitions  Intense (8)    Cannot complete any ADLs without much assistance/cannot concentrate/conversation is difficult/unable to sleep and unable to use distraction. Severe range order  Average Pain 8   +Driver, -BT, -Dye Allergies.  Right knee pain

## 2022-10-07 NOTE — Telephone Encounter (Signed)
Please advised  

## 2022-10-07 NOTE — Patient Instructions (Signed)

## 2022-10-08 ENCOUNTER — Other Ambulatory Visit (HOSPITAL_BASED_OUTPATIENT_CLINIC_OR_DEPARTMENT_OTHER): Payer: Self-pay

## 2022-10-08 ENCOUNTER — Other Ambulatory Visit: Payer: Self-pay | Admitting: *Deleted

## 2022-10-08 ENCOUNTER — Encounter: Payer: Self-pay | Admitting: Physical Medicine and Rehabilitation

## 2022-10-08 MED ORDER — TIRZEPATIDE 15 MG/0.5ML ~~LOC~~ SOAJ
15.0000 mg | SUBCUTANEOUS | 1 refills | Status: DC
  Filled 2022-10-08: qty 2, 28d supply, fill #0

## 2022-10-08 NOTE — Telephone Encounter (Signed)
Spoke with patient, stated taking Rx toradol daily  Last dose was taken today  Recommendation given, not do this more than 5 days at a time, stated she was not told that  She stated needing Rx daily

## 2022-10-08 NOTE — Telephone Encounter (Signed)
Linda [name] from pharmacy called for an update. Would like to know if should be filled or not since patient had this medication prescribed just last month. Requests a callback at [phone].

## 2022-10-09 ENCOUNTER — Other Ambulatory Visit (HOSPITAL_BASED_OUTPATIENT_CLINIC_OR_DEPARTMENT_OTHER): Payer: Self-pay

## 2022-10-09 ENCOUNTER — Other Ambulatory Visit: Payer: Self-pay

## 2022-10-09 ENCOUNTER — Other Ambulatory Visit: Payer: Self-pay | Admitting: Physical Medicine and Rehabilitation

## 2022-10-09 DIAGNOSIS — G8929 Other chronic pain: Secondary | ICD-10-CM

## 2022-10-09 MED ORDER — DICLOFENAC SODIUM 75 MG PO TBEC
75.0000 mg | DELAYED_RELEASE_TABLET | Freq: Two times a day (BID) | ORAL | 0 refills | Status: DC
  Filled 2022-10-09: qty 30, 15d supply, fill #0

## 2022-10-09 NOTE — Telephone Encounter (Signed)
The manufacturer recommends only 5 days at a time.  We can try diclofenac 9m bid prn. This is very similar to the toradol and should be just as effective. It is ok for her to use this for longer periods of time.  Please send in new rx if she is agreeable.  CAlgis Greenhouse PJerline Pain MD 10/09/2022 11:05 AM

## 2022-10-09 NOTE — Telephone Encounter (Signed)
Patient aware diclofenac 75 mg BID sent to pharmacy

## 2022-10-09 NOTE — Progress Notes (Signed)
Greater than 80% relief of pain with 1st genicular nerve blocks per pain diary. Will second 2nd set of blocks.

## 2022-10-21 ENCOUNTER — Encounter: Payer: 59 | Admitting: Physical Medicine and Rehabilitation

## 2022-10-21 ENCOUNTER — Encounter: Payer: Self-pay | Admitting: Physical Medicine and Rehabilitation

## 2022-10-21 MED ORDER — BUPIVACAINE HCL 0.5 % IJ SOLN
5.0000 mL | INTRAMUSCULAR | Status: AC | PRN
  Administered 2022-10-07: 5 mL via INTRA_ARTICULAR

## 2022-10-21 NOTE — Progress Notes (Signed)
[name] - 42 y.o. female MRN [id number]  Date of birth: [birth year]  Office Visit Note: Visit Date: 10/07/2022 PCP: Vivi Barrack, MD Referred by: Vivi Barrack, MD  Subjective: Chief Complaint  Patient presents with   Right Knee - Pain   HPI:  Linda [name] is a 42 y.o. female who comes in today at the request of Barnet Pall, FNP and Dr. Lynne Leader for planned Right  Genicular nerve block block with fluoroscopic guidance.  The patient has failed conservative care including injections, home exercise, medications, time and activity modification.  Nerve block will be performed in a double block paradigm with goal towards radiofrequency ablation of the same genicular nerves for longer term definitive care.  Please see requesting physician notes for further details and justification.     ROS Otherwise per HPI.  Assessment & Plan: Visit Diagnoses:    ICD-10-CM   1. Chronic pain of right knee  M25.561 XR C-ARM NO REPORT   G89.29       Plan: No additional findings.   Meds & Orders: No orders of the defined types were placed in this encounter.   Orders Placed This Encounter  Procedures   Large Joint Inj   XR C-ARM NO REPORT    Follow-up: No follow-ups on file.   Procedures: Large Joint Inj on 10/07/2022 9:30 AM Indications: pain and diagnostic evaluation Details: 25 G 3.5 in needle, fluoroscopy-guided anterior approach Medications: 5 mL bupivacaine 0.5 % Outcome: tolerated well, no immediate complications  Genicular Nerve Blockade with Fluoroscopic Guidance  [name]      Date of Birth: November 05, 1980 MRN: [id number] PCP: Vivi Barrack, MD         Universal Protocol:    Date/Time: 02/26/248:11 PM  Consent Given By: the patient  Position: SUPINE  Additional Comments: Vital signs were monitored before and after the procedure. Patient was prepped and draped in the usual sterile fashion. The correct patient,  procedure, and site was verified.   Injection Procedure Details:  Procedure Site One Meds Administered: No orders of the defined types were placed in this encounter.    Laterality: Right  Location/Site:  Superior medial, superior lateral and inferior medial genicular nerve  Needle size: 3.5 inch 25-gauge spinal needle  Needle Placement: Just posterior to midline on the lateral view at the locations noted in the procedure note.   Findings:    -Comments: Excellent position of the needle tip using biplanar imaging with low contrast showing no vascular flow.  Procedure Details: The fluoroscope was positioned in a 90 degree anteroposterior (AP) fluoroscopic view and then tilted to obtain a true AP view of the knee joint.  The target areas are the mid-point of the curve formed where the femoral shaft and epicondyles meet as well as the midpoint of the curve on the medial side where the tibial shaft meets the plateau.  Using fluoroscopic guidance the target areas were infiltrated using a 27-gauge needle and 1-2 mL of 1% lidocaine without epinephrine to provide local anesthetic.  Then, for each target area a 3-1/2 inch 25-gauge spinal needle was driven down under intermittent fluoroscopic guidance to the periosteum of the target site.  Once all 3 needles were in place in the AP view, a lateral view was obtained at each needle was driven control to the midpoint of the shafts respectively.  A spot film was obtained of the lateral view and saved.  Next, an AP view was obtained once again to  confirm placement.  A spot film then was obtained and saved.  After fluoroscopic imaging was used to determine and confirm placement, 0.5-1 mL of injectate was delivered at each target location.  The patient was given a pain diary to complete and return to the office.      Additional Comments:  The patient tolerated the procedure well Dressing: Band-Aid    Post-procedure details: Patient was observed  during the procedure. Post-procedure instructions were reviewed.  Patient left the clinic in stable condition.         Consent was given by the patient. Immediately prior to procedure a time out was called to verify the correct patient, procedure, equipment, support staff and site/side marked as required. Patient was prepped and draped in the usual sterile fashion.          Clinical History: EXAM: MRI OF THE LEFT KNEE WITHOUT CONTRAST   TECHNIQUE: Multiplanar, multisequence MR imaging of the knee was performed. No intravenous contrast was administered.   COMPARISON:  None.   FINDINGS: MENISCI   Medial: Small radial tear of the free edge of the posterior horn of the medial meniscus.   Lateral: Intact.   LIGAMENTS   Cruciates: ACL and PCL are intact.   Collaterals: Medial collateral ligament is intact. Lateral collateral ligament complex is intact.   CARTILAGE   Patellofemoral: Partial-thickness cartilage loss of the lateral patellar facet and patellar apex with subchondral reactive marrow edema.   Medial: Mild partial-thickness cartilage loss of the medial femorotibial compartment.   Lateral:  No chondral defect.   JOINT: Small joint effusion. Mild edema in superolateral Hoffa's. No plical thickening.   POPLITEAL FOSSA: Popliteus tendon is intact. Tiny Baker's cyst.   EXTENSOR MECHANISM: Intact quadriceps tendon. Intact patellar tendon. Intact lateral patellar retinaculum. Intact medial patellar retinaculum. Intact MPFL.   BONES: No aggressive osseous lesion. No fracture or dislocation.   Other: No fluid collection or hematoma. Muscles are normal.   IMPRESSION: 1. Small radial tear of the free edge of the posterior horn of the medial meniscus. 2. Partial-thickness cartilage loss of the lateral patellar facet and patellar apex with subchondral reactive marrow edema. 3. Mild partial-thickness cartilage loss of the medial femorotibial compartment.      Electronically Signed   By: Kathreen Devoid   On: 11/12/2020 09:11     Objective:  VS:  HT:    WT:   BMI:     BP:(!) 142/84  HR:95bpm  TEMP: ( )  RESP:  Physical Exam   Imaging: No results found.

## 2022-10-28 ENCOUNTER — Encounter (HOSPITAL_BASED_OUTPATIENT_CLINIC_OR_DEPARTMENT_OTHER): Payer: Self-pay | Admitting: Pharmacist

## 2022-10-28 ENCOUNTER — Telehealth: Payer: Self-pay | Admitting: Family Medicine

## 2022-10-28 ENCOUNTER — Other Ambulatory Visit (HOSPITAL_BASED_OUTPATIENT_CLINIC_OR_DEPARTMENT_OTHER): Payer: Self-pay

## 2022-10-28 ENCOUNTER — Encounter: Payer: Self-pay | Admitting: Family Medicine

## 2022-10-28 ENCOUNTER — Other Ambulatory Visit: Payer: Self-pay | Admitting: *Deleted

## 2022-10-28 MED ORDER — DICLOFENAC SODIUM 75 MG PO TBEC
75.0000 mg | DELAYED_RELEASE_TABLET | Freq: Two times a day (BID) | ORAL | 0 refills | Status: DC
  Filled 2022-10-28: qty 30, 15d supply, fill #0

## 2022-10-28 MED ORDER — TIRZEPATIDE 15 MG/0.5ML ~~LOC~~ SOAJ
15.0000 mg | SUBCUTANEOUS | 1 refills | Status: DC
  Filled 2022-10-28 – 2022-10-31 (×2): qty 2, 28d supply, fill #0
  Filled 2022-12-06: qty 2, 28d supply, fill #1

## 2022-10-28 NOTE — Telephone Encounter (Signed)
Patient called requesting an appointment for a repeat Zilretta injection.  Last given 06/10/2022.  Can we work on getting this authorized?  Will contact patient to schedule when ready.

## 2022-10-28 NOTE — Telephone Encounter (Signed)
Rx send to [facility]

## 2022-10-28 NOTE — Telephone Encounter (Signed)
Ok to refill both.  Algis Greenhouse. Jerline Pain, MD 10/28/2022 10:45 AM

## 2022-10-28 NOTE — Telephone Encounter (Signed)
VOB initiated for BILAT Zilretta

## 2022-10-28 NOTE — Telephone Encounter (Signed)
Please advise 

## 2022-10-29 NOTE — Telephone Encounter (Signed)
[place] policy does not have broad-based coverage for Zilretta but may provide coverage with medical necessity. To determine if your patient might have coverage for Zilretta, [employer] recommends that the provider may initiate a pre-determination with the plan. Please fax the attached pre-determination form and medical records to the [facility] Pre-Determination Department directly at [phone]. To verbally initiate the Pre-Determination process, and to obtain benefits for your patient, please contact [employer] directly at [place]

## 2022-10-30 NOTE — Telephone Encounter (Signed)
Prior Auth/Pre-determination initiated for via Availity. PA# [id number]

## 2022-10-31 ENCOUNTER — Other Ambulatory Visit: Payer: Self-pay

## 2022-10-31 ENCOUNTER — Other Ambulatory Visit (HOSPITAL_BASED_OUTPATIENT_CLINIC_OR_DEPARTMENT_OTHER): Payer: Self-pay

## 2022-11-04 NOTE — Telephone Encounter (Addendum)
Per [employer], prior auth only required for [employer] members   [email or web address]

## 2022-11-15 NOTE — Telephone Encounter (Signed)
Attempting benefits determination via FAX request.

## 2022-11-26 NOTE — Telephone Encounter (Signed)
Coverage for Zilretta denied d/t lack of medial necessity - "Extended-release triamcinolone acetonide has not demonstrated a significant improvement in osteoarthritis pain compared with immediate-release formulation of triamcinolone".

## 2022-12-05 IMAGING — DX DG KNEE 1-2V*L*
4 series · 4 of 4 positions shown · non-contrast
Comparison: None.

CLINICAL DATA: Knee pain

EXAM:
LEFT KNEE - 1-2 VIEW

[knee standing ap]
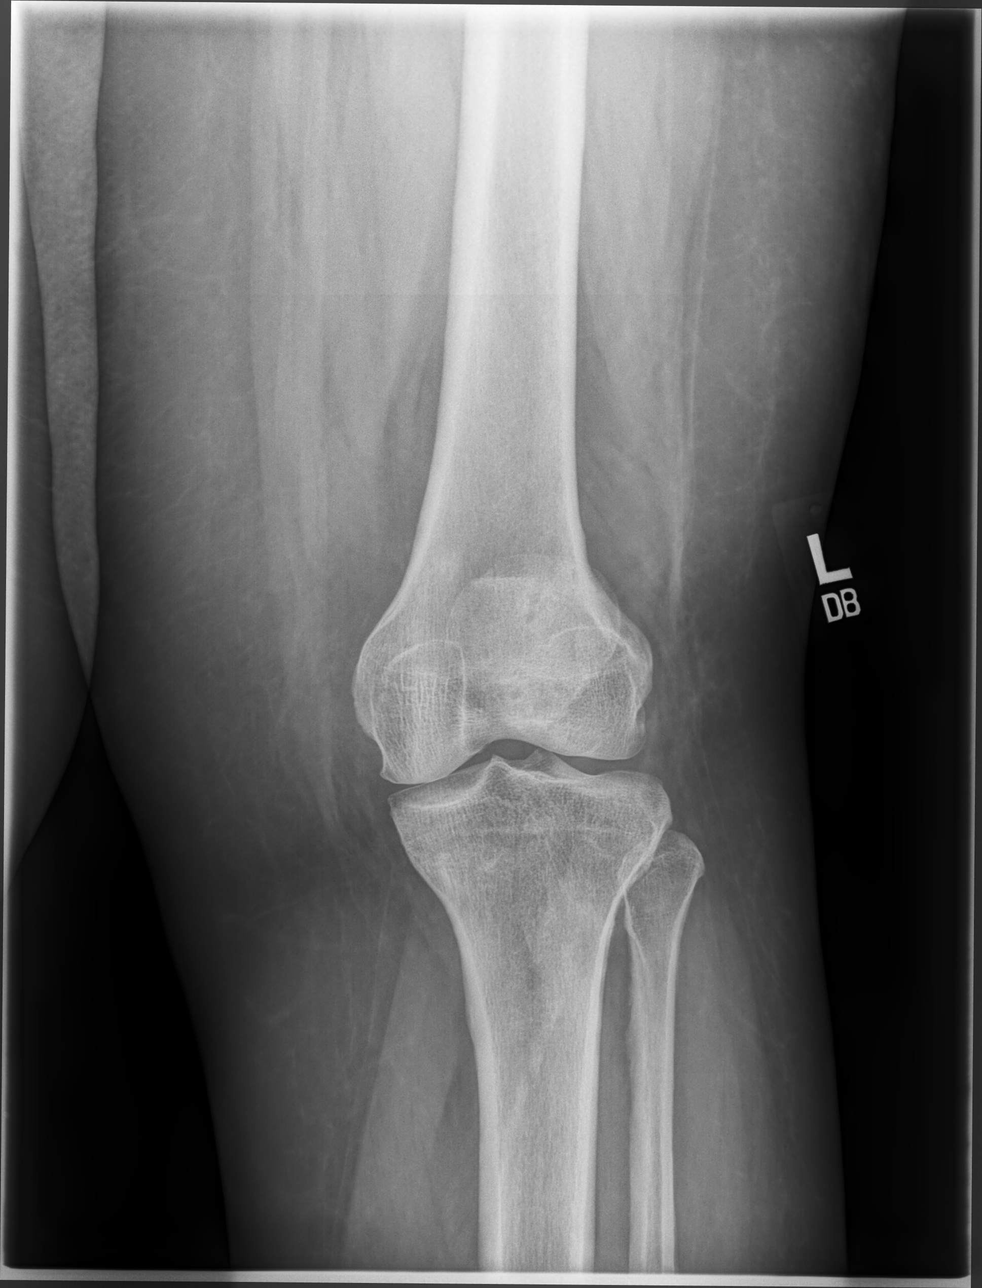

[knee standing lat]
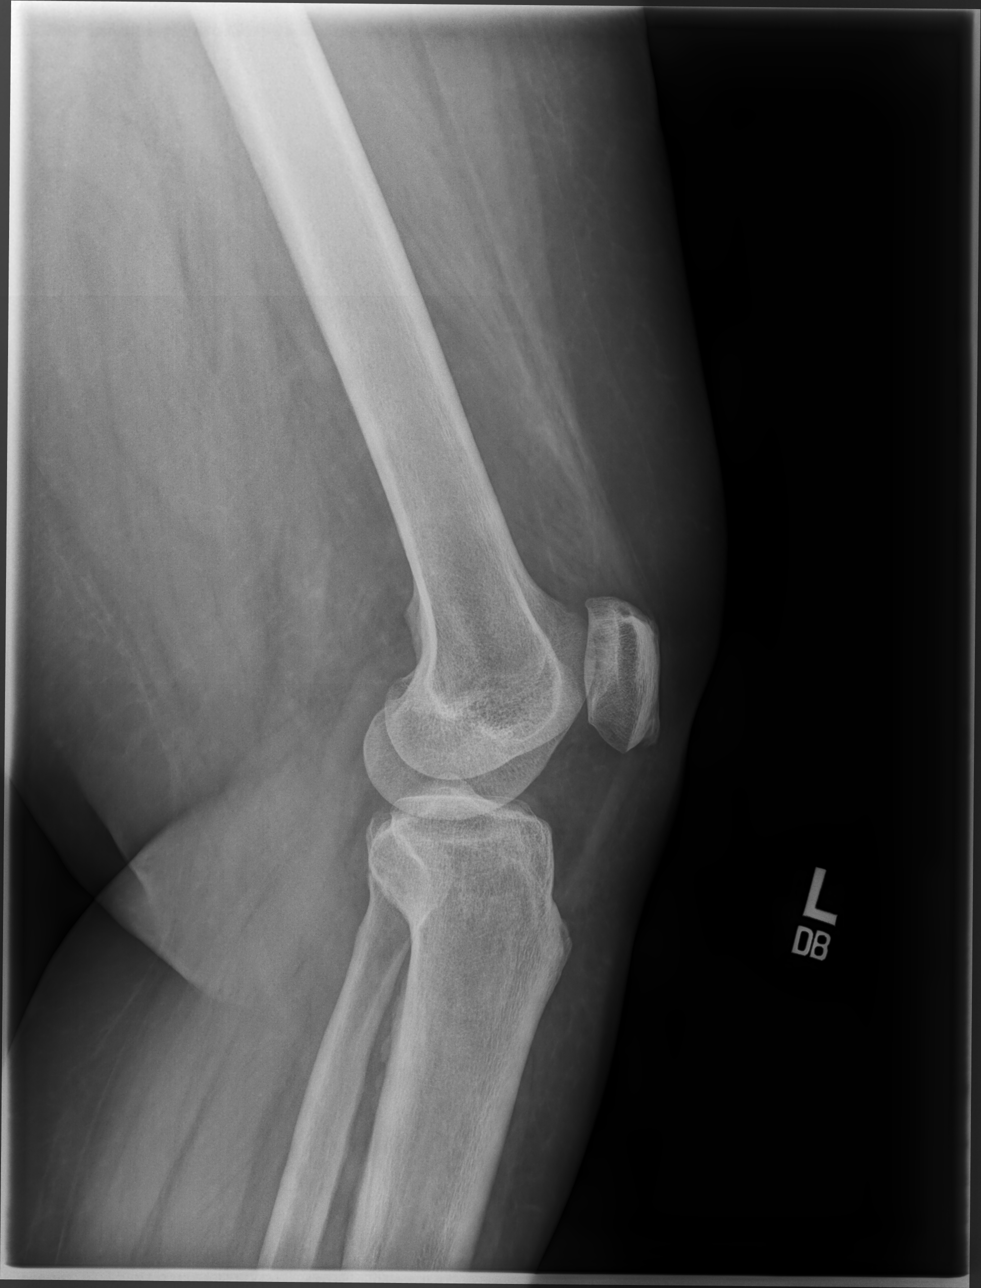

[sunrise]
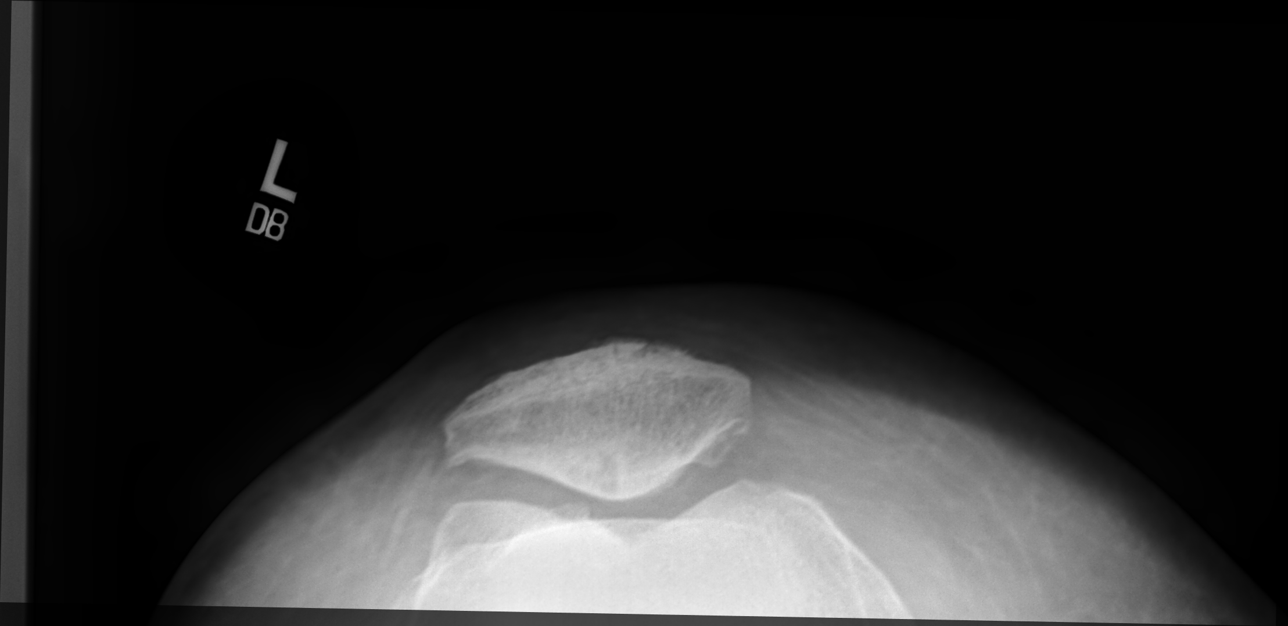

[patella lat]
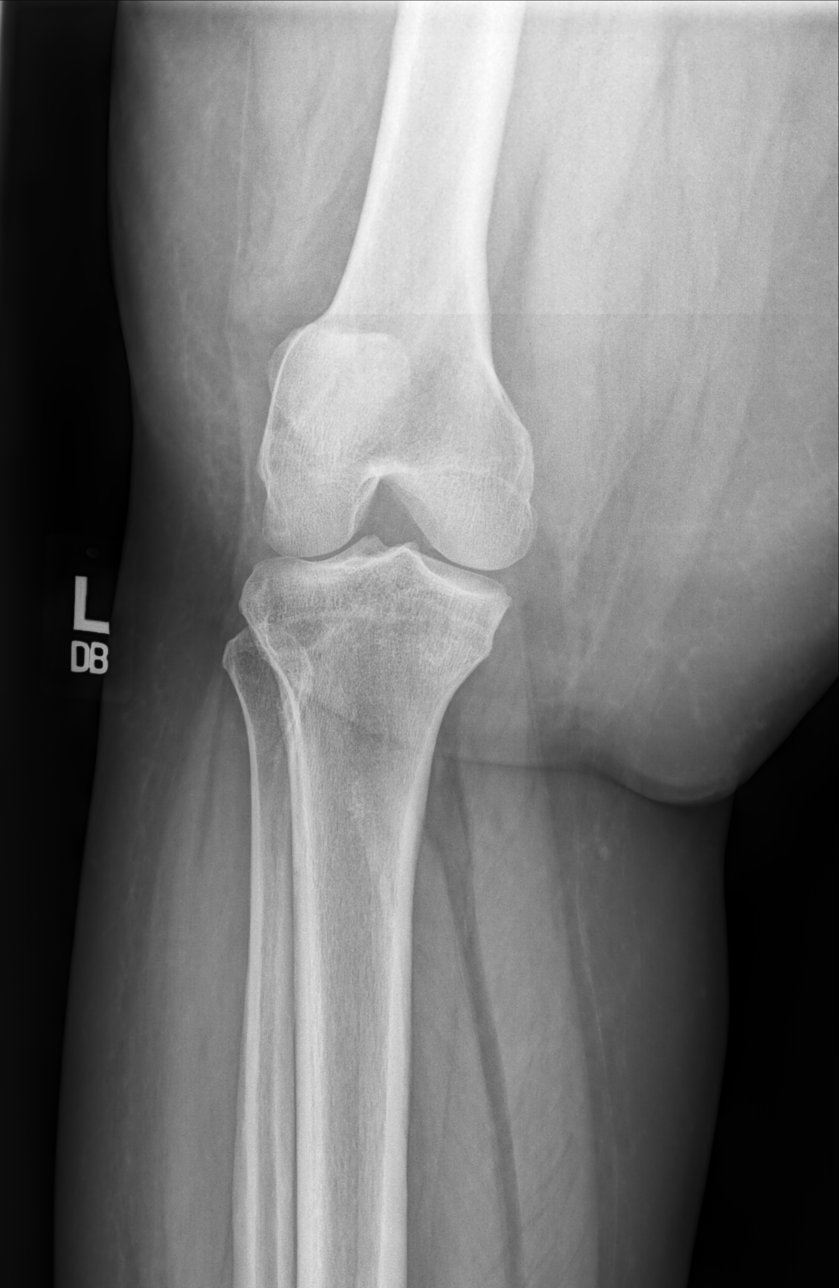

[4 of 4 positions shown; findings below may reference images not displayed]

FINDINGS: No acute fracture or dislocation. No significant tricompartmental
degenerative changes. Enthesophyte of the patellar tendon insertion
on the patella. No area of erosion or osseous destruction. No
unexpected radiopaque foreign body. Soft tissues are unremarkable.
IMPRESSION: No acute findings.

## 2022-12-06 ENCOUNTER — Other Ambulatory Visit (HOSPITAL_BASED_OUTPATIENT_CLINIC_OR_DEPARTMENT_OTHER): Payer: Self-pay

## 2022-12-06 ENCOUNTER — Other Ambulatory Visit: Payer: Self-pay

## 2022-12-06 ENCOUNTER — Telehealth: Payer: Self-pay | Admitting: Family Medicine

## 2022-12-06 ENCOUNTER — Other Ambulatory Visit: Payer: Self-pay | Admitting: Family Medicine

## 2022-12-06 MED ORDER — DICLOFENAC SODIUM 75 MG PO TBEC
75.0000 mg | DELAYED_RELEASE_TABLET | Freq: Two times a day (BID) | ORAL | 0 refills | Status: DC
  Filled 2022-12-06: qty 30, 15d supply, fill #0

## 2022-12-06 NOTE — Telephone Encounter (Signed)
Patient states Pharmacy is out of stock (back order) of  tirzepatide (MOUNJARO) 15 MG/0.5ML Pen   Requests RX for Mounajaro 7.5MG  with dosage to be adjusted accordingly.   [facility] Phone: [phone]  Fax: [phone]

## 2022-12-06 NOTE — Telephone Encounter (Signed)
Dr. Denyse Amass - would you like for me to run benefits for [employer] as coverage for Zilretta has been denied by [employer].   [place], will you let pt know that Monia Pouch has denied coverage for Zilretta and we are checking with Dr. Denyse Amass for alternative options.   Thanks team!

## 2022-12-06 NOTE — Telephone Encounter (Signed)
Left message informing patient.

## 2022-12-09 ENCOUNTER — Encounter: Payer: Self-pay | Admitting: Family Medicine

## 2022-12-09 ENCOUNTER — Telehealth: Payer: Self-pay

## 2022-12-09 NOTE — Telephone Encounter (Signed)
Please advise 

## 2022-12-09 NOTE — Telephone Encounter (Signed)
Coverage denied for Zilretta.   Check benefits for visco for BILAT knee OA

## 2022-12-09 NOTE — Telephone Encounter (Signed)
Ok to send in rx for 7.5mg  dose.  Katina Degree. Jimmey Ralph, MD 12/09/2022 12:58 PM

## 2022-12-09 NOTE — Telephone Encounter (Signed)
VOB initiated for ORTHOVISC for BILAT knee OA.   Zilretta coverage denied

## 2022-12-10 ENCOUNTER — Other Ambulatory Visit: Payer: Self-pay | Admitting: *Deleted

## 2022-12-10 ENCOUNTER — Other Ambulatory Visit: Payer: Self-pay

## 2022-12-10 ENCOUNTER — Other Ambulatory Visit (HOSPITAL_BASED_OUTPATIENT_CLINIC_OR_DEPARTMENT_OTHER): Payer: Self-pay

## 2022-12-10 MED ORDER — TIRZEPATIDE 7.5 MG/0.5ML ~~LOC~~ SOAJ
7.5000 mg | SUBCUTANEOUS | 0 refills | Status: DC
  Filled 2022-12-10: qty 6, 84d supply, fill #0

## 2022-12-10 MED ORDER — TIRZEPATIDE 15 MG/0.5ML ~~LOC~~ SOAJ
15.0000 mg | SUBCUTANEOUS | 1 refills | Status: DC
  Filled 2022-12-10: qty 2, 28d supply, fill #0
  Filled 2022-12-29 – 2023-01-02 (×2): qty 2, 28d supply, fill #1

## 2022-12-10 NOTE — Telephone Encounter (Signed)
Ok with me. Please place any necessary orders. 

## 2022-12-10 NOTE — Telephone Encounter (Signed)
Prior auth required for ORTHOVISC for BILAT knee OA        

## 2022-12-10 NOTE — Telephone Encounter (Signed)
Spoke with pharmacy medication arrived today  No need to send new Rx  Patient notified

## 2022-12-10 NOTE — Telephone Encounter (Signed)
Pharmacy has medication on stock, no need of new Rx  Patient aware

## 2022-12-10 NOTE — Telephone Encounter (Signed)
Please advise if new dosage can be sent to pharmacy

## 2022-12-11 NOTE — Telephone Encounter (Signed)
Prior Authorization initiated for [facility]  via [employer] Case ID: [id number]

## 2022-12-12 NOTE — Telephone Encounter (Signed)
Orthovisc for BILAT knee OA (Zilretta coverage denied)  Primary Insurance: Community education officer [facility]) Co-Pay: $0 Co-insurance: 20%  Deductible: DOES APPLY; $504.32 of $1600 met, once met pt will only be responsible for co-insurance  Prior Auth: APPROVED PA# [id number] Valid: 12/11/22-12/11/23

## 2022-12-12 NOTE — Telephone Encounter (Signed)
Orthovisc APPROVED for BILAT knee OA  PA# [id number]  Valid: 12/11/22-12/11/23

## 2022-12-13 NOTE — Telephone Encounter (Signed)
Patient will call us back to schedule. She would like to wait until she meets her deductible before scheduling.

## 2022-12-29 ENCOUNTER — Other Ambulatory Visit: Payer: Self-pay | Admitting: Family Medicine

## 2022-12-30 ENCOUNTER — Other Ambulatory Visit (HOSPITAL_BASED_OUTPATIENT_CLINIC_OR_DEPARTMENT_OTHER): Payer: Self-pay

## 2022-12-30 ENCOUNTER — Other Ambulatory Visit: Payer: Self-pay

## 2022-12-30 MED ORDER — DICLOFENAC SODIUM 75 MG PO TBEC
75.0000 mg | DELAYED_RELEASE_TABLET | Freq: Two times a day (BID) | ORAL | 0 refills | Status: DC
  Filled 2022-12-30: qty 30, 15d supply, fill #0

## 2023-01-03 ENCOUNTER — Other Ambulatory Visit (HOSPITAL_BASED_OUTPATIENT_CLINIC_OR_DEPARTMENT_OTHER): Payer: Self-pay

## 2023-01-07 ENCOUNTER — Other Ambulatory Visit (HOSPITAL_BASED_OUTPATIENT_CLINIC_OR_DEPARTMENT_OTHER): Payer: Self-pay

## 2023-01-07 MED ORDER — OMRON 3 SERIES BP MONITOR DEVI
0 refills | Status: AC
  Filled 2023-01-07: qty 1, 30d supply, fill #0

## 2023-01-08 ENCOUNTER — Encounter: Payer: Self-pay | Admitting: Family Medicine

## 2023-01-08 NOTE — Telephone Encounter (Signed)
Patient aware need to call other pharmacy of her choice for mediation in stock

## 2023-01-10 ENCOUNTER — Other Ambulatory Visit (HOSPITAL_BASED_OUTPATIENT_CLINIC_OR_DEPARTMENT_OTHER): Payer: Self-pay

## 2023-01-13 ENCOUNTER — Other Ambulatory Visit (HOSPITAL_BASED_OUTPATIENT_CLINIC_OR_DEPARTMENT_OTHER): Payer: Self-pay

## 2023-01-13 ENCOUNTER — Other Ambulatory Visit: Payer: Self-pay | Admitting: *Deleted

## 2023-01-13 MED ORDER — SEMAGLUTIDE (2 MG/DOSE) 8 MG/3ML ~~LOC~~ SOPN
2.0000 mg | PEN_INJECTOR | SUBCUTANEOUS | 1 refills | Status: DC
  Filled 2023-01-13: qty 3, 28d supply, fill #0
  Filled 2023-01-28 – 2023-02-05 (×2): qty 3, 28d supply, fill #1

## 2023-01-13 NOTE — Telephone Encounter (Signed)
Ok to send in Ozempic 2 mg weekly.  Katina Degree. Jimmey Ralph, MD 01/13/2023 2:52 PM

## 2023-01-13 NOTE — Telephone Encounter (Signed)
Rx Ozempic 2mg  send to pharmacy  Please advise, pt question

## 2023-01-13 NOTE — Telephone Encounter (Signed)
As far as I'm aware, the oral version is not available yet.  We can try Ozempic 2 mg weekly as alternative.  This will be probably the closest to her Greggory Keen that we can get right now.  Katina Degree. Jimmey Ralph, MD 01/13/2023 9:03 AM

## 2023-01-28 ENCOUNTER — Encounter (HOSPITAL_BASED_OUTPATIENT_CLINIC_OR_DEPARTMENT_OTHER): Payer: Self-pay | Admitting: Pharmacist

## 2023-01-28 ENCOUNTER — Other Ambulatory Visit (HOSPITAL_BASED_OUTPATIENT_CLINIC_OR_DEPARTMENT_OTHER): Payer: Self-pay

## 2023-01-28 ENCOUNTER — Other Ambulatory Visit: Payer: Self-pay

## 2023-01-28 ENCOUNTER — Other Ambulatory Visit: Payer: Self-pay | Admitting: Family Medicine

## 2023-01-28 MED ORDER — DICLOFENAC SODIUM 75 MG PO TBEC
75.0000 mg | DELAYED_RELEASE_TABLET | Freq: Two times a day (BID) | ORAL | 0 refills | Status: DC
  Filled 2023-01-28: qty 30, 15d supply, fill #0

## 2023-02-13 ENCOUNTER — Other Ambulatory Visit (HOSPITAL_COMMUNITY): Payer: Self-pay

## 2023-03-03 ENCOUNTER — Encounter: Payer: Self-pay | Admitting: Family Medicine

## 2023-03-03 ENCOUNTER — Other Ambulatory Visit: Payer: Self-pay | Admitting: Family Medicine

## 2023-03-03 DIAGNOSIS — I10 Essential (primary) hypertension: Secondary | ICD-10-CM

## 2023-03-04 ENCOUNTER — Other Ambulatory Visit: Payer: Self-pay | Admitting: *Deleted

## 2023-03-04 ENCOUNTER — Other Ambulatory Visit (HOSPITAL_BASED_OUTPATIENT_CLINIC_OR_DEPARTMENT_OTHER): Payer: Self-pay

## 2023-03-04 MED ORDER — DICLOFENAC SODIUM 75 MG PO TBEC
75.0000 mg | DELAYED_RELEASE_TABLET | Freq: Two times a day (BID) | ORAL | 0 refills | Status: DC
  Filled 2023-03-04: qty 30, 15d supply, fill #0

## 2023-03-04 MED ORDER — TIRZEPATIDE 15 MG/0.5ML ~~LOC~~ SOAJ
15.0000 mg | SUBCUTANEOUS | 1 refills | Status: DC
  Filled 2023-03-04: qty 2, 28d supply, fill #0
  Filled 2023-03-28: qty 2, 28d supply, fill #1
  Filled 2023-04-25: qty 2, 28d supply, fill #2
  Filled 2023-05-25: qty 2, 28d supply, fill #3
  Filled 2023-06-22: qty 2, 28d supply, fill #4
  Filled 2023-07-25: qty 2, 28d supply, fill #5

## 2023-03-04 MED ORDER — AMLODIPINE BESYLATE 10 MG PO TABS
10.0000 mg | ORAL_TABLET | Freq: Every day | ORAL | 3 refills | Status: DC
Start: 2023-03-04 — End: 2024-01-28
  Filled 2023-03-04 – 2023-03-12 (×3): qty 90, 90d supply, fill #0
  Filled 2023-05-25 – 2023-05-31 (×4): qty 90, 90d supply, fill #1
  Filled 2023-08-31: qty 90, 90d supply, fill #2
  Filled 2023-11-27: qty 90, 90d supply, fill #3

## 2023-03-04 NOTE — Telephone Encounter (Signed)
Please advise 

## 2023-03-04 NOTE — Telephone Encounter (Signed)
Ok to send in.  Katina Degree. Jimmey Ralph, MD 03/04/2023 2:30 PM

## 2023-03-04 NOTE — Telephone Encounter (Signed)
[facility]

## 2023-03-11 ENCOUNTER — Other Ambulatory Visit: Payer: Self-pay

## 2023-03-12 ENCOUNTER — Other Ambulatory Visit (HOSPITAL_BASED_OUTPATIENT_CLINIC_OR_DEPARTMENT_OTHER): Payer: Self-pay

## 2023-03-24 ENCOUNTER — Ambulatory Visit: Payer: 59 | Admitting: Family Medicine

## 2023-03-24 ENCOUNTER — Ambulatory Visit (INDEPENDENT_AMBULATORY_CARE_PROVIDER_SITE_OTHER)
Admission: RE | Admit: 2023-03-24 | Discharge: 2023-03-24 | Disposition: A | Discharge status: Home / Self Care | Payer: 59 | Source: Ambulatory Visit | Attending: Family Medicine | Admitting: Family Medicine

## 2023-03-24 ENCOUNTER — Encounter: Payer: Self-pay | Admitting: Family Medicine

## 2023-03-24 VITALS — BP 130/85 | HR 75 | Temp 98.0°F | Ht 66.5 in | Wt 240.8 lb

## 2023-03-24 DIAGNOSIS — M7989 Other specified soft tissue disorders: Secondary | ICD-10-CM | POA: Diagnosis not present

## 2023-03-24 DIAGNOSIS — M25562 Pain in left knee: Secondary | ICD-10-CM

## 2023-03-24 DIAGNOSIS — Z13220 Encounter for screening for lipoid disorders: Secondary | ICD-10-CM

## 2023-03-24 DIAGNOSIS — I10 Essential (primary) hypertension: Secondary | ICD-10-CM | POA: Diagnosis not present

## 2023-03-24 DIAGNOSIS — M25572 Pain in left ankle and joints of left foot: Secondary | ICD-10-CM

## 2023-03-24 DIAGNOSIS — Z0001 Encounter for general adult medical examination with abnormal findings: Secondary | ICD-10-CM | POA: Diagnosis not present

## 2023-03-24 DIAGNOSIS — K219 Gastro-esophageal reflux disease without esophagitis: Secondary | ICD-10-CM

## 2023-03-24 DIAGNOSIS — E1165 Type 2 diabetes mellitus with hyperglycemia: Secondary | ICD-10-CM

## 2023-03-24 DIAGNOSIS — D509 Iron deficiency anemia, unspecified: Secondary | ICD-10-CM | POA: Diagnosis not present

## 2023-03-24 LAB — LIPID PANEL
Cholesterol: 172 mg/dL (ref 0–200)
HDL: 46.6 mg/dL (ref >39.00)
LDL Cholesterol: 109 mg/dL — ABNORMAL HIGH (ref 0–99)
NonHDL: 125.4
Total CHOL/HDL Ratio: 4
Triglycerides: 83 mg/dL (ref 0.0–149.0)
VLDL: 16.6 mg/dL (ref 0.0–40.0)

## 2023-03-24 LAB — COMPREHENSIVE METABOLIC PANEL
ALT: 42 U/L — ABNORMAL HIGH (ref 0–35)
AST: 22 U/L (ref 0–37)
Albumin: 3.8 g/dL (ref 3.5–5.2)
Alkaline Phosphatase: 79 U/L (ref 39–117)
BUN: 8 mg/dL (ref 6–23)
CO2: 24 mEq/L (ref 19–32)
Calcium: 9 mg/dL (ref 8.4–10.5)
Chloride: 105 mEq/L (ref 96–112)
Creatinine, Ser: 0.69 mg/dL (ref 0.40–1.20)
GFR: 108.42 mL/min (ref >60.00)
Glucose, Bld: 89 mg/dL (ref 70–99)
Potassium: 3.4 mEq/L — ABNORMAL LOW (ref 3.5–5.1)
Sodium: 138 mEq/L (ref 135–145)
Total Bilirubin: 0.2 mg/dL (ref 0.2–1.2)
Total Protein: 7.3 g/dL (ref 6.0–8.3)

## 2023-03-24 LAB — IBC + FERRITIN
Ferritin: 35.6 ng/mL (ref 10.0–291.0)
Iron: 25 ug/dL — ABNORMAL LOW (ref 42–145)
Saturation Ratios: 8.2 % — ABNORMAL LOW (ref 20.0–50.0)
TIBC: 305.2 ug/dL (ref 250.0–450.0)
Transferrin: 218 mg/dL (ref 212.0–360.0)

## 2023-03-24 LAB — CBC
HCT: 38.2 % (ref 36.0–46.0)
Hemoglobin: 12.1 g/dL (ref 12.0–15.0)
MCHC: 31.5 g/dL (ref 30.0–36.0)
MCV: 81 fl (ref 78.0–100.0)
Platelets: 297 10*3/uL (ref 150.0–400.0)
RBC: 4.72 Mil/uL (ref 3.87–5.11)
RDW: 14.9 % (ref 11.5–15.5)
WBC: 6.8 10*3/uL (ref 4.0–10.5)

## 2023-03-24 LAB — HEMOGLOBIN A1C: Hgb A1c MFr Bld: 5.3 % (ref 4.6–6.5)

## 2023-03-24 LAB — TSH: TSH: 1.63 u[IU]/mL (ref 0.35–5.50)

## 2023-03-24 NOTE — Progress Notes (Signed)
Chief Complaint:  Linda [name] is a 42 y.o. female who presents today for her annual comprehensive physical exam.    Assessment/Plan:  New/Acute Problems: Left Knee and Ankle Pain Likely contusion/sprain from recent fall however we will check plain films to further evaluate and rule out fracture.  She will continue with elevation.  Also continue NSAIDs.  She can continue using ice as well.  Depending on results of x-rays may need referral to orthopedics or sports medicine.  Chronic Problems Addressed Today: Morbid obesity (HCC) Patient down 40 pounds since our last visit.  Congratulated on her weight loss.  She will continue with Mounjaro 15 mg once weekly.  She is tolerating well.  Recheck again in 6 months or so.  Essential hypertension Blood pressure at goal today on amlodipine 10 mg daily.  Iron deficiency anemia Check iron.  She has had more cold intolerance lately.  Discussed with patient this may be due to her weight loss over the last several months however still check labs today.  T2DM (type 2 diabetes mellitus) (HCC) Check A1c with labs.  She is on Mounjaro 15 mg weekly.  Tolerating well.  She is also working on diet and exercise.  Preventative Healthcare: Check labs.  Up-to-date on vaccines.  Patient Counseling(The following topics were reviewed and/or handout was given):  -Nutrition: Stressed importance of moderation in sodium/caffeine intake, saturated fat and cholesterol, caloric balance, sufficient intake of fresh fruits, vegetables, and fiber.  -Stressed the importance of regular exercise.   -Substance Abuse: Discussed cessation/primary prevention of tobacco, alcohol, or other drug use; driving or other dangerous activities under the influence; availability of treatment for abuse.   -Injury prevention: Discussed safety belts, safety helmets, smoke detector, smoking near bedding or upholstery.   -Sexuality: Discussed sexually transmitted diseases, partner  selection, use of condoms, avoidance of unintended pregnancy and contraceptive alternatives.   -Dental health: Discussed importance of regular tooth brushing, flossing, and dental visits.  -Health maintenance and immunizations reviewed. Please refer to Health maintenance section.  Return to care in 1 year for next preventative visit.     Subjective:  HPI:  See A/P for status of chronic conditions.  Patient fell a couple of days ago.  Was going into her apartment complex when she tripped over concrete.  Landed on her left side.  Landed on left knee and ankle.  She has had significant pain to both of these areas over the last day or so.  She is able to walk however this is painful.  Has been using over-the-counter meds and leftover diclofenac.  Also been using ice and compression.  Lifestyle Diet: Cutting down on sugars and carbohydrates.  Exercise: Walking a lot with work.      03/24/2023    1:38 PM  Depression screen PHQ 2/9  Decreased Interest 0  Down, Depressed, Hopeless 0  PHQ - 2 Score 0    Health Maintenance Due  Topic Date Due   FOOT EXAM  Never done   OPHTHALMOLOGY EXAM  Never done   Diabetic kidney evaluation - eGFR measurement  03/22/2023   HEMOGLOBIN A1C  03/21/2023     ROS: Per HPI, otherwise a complete review of systems was negative.   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   Abnormal uterine bleeding 07/06/2021   Morbid obesity (HCC) 11/19/2019   Polyarthralgia 11/19/2019   T2DM (type 2 diabetes mellitus) (HCC) 11/19/2019   Left knee pain 02/08/2019   Essential hypertension 02/08/2019   Gastroesophageal reflux disease 02/08/2019   Frequent headaches 02/08/2019   Iron deficiency anemia 03/27/2017   Past Surgical History:   Procedure Laterality Date   CESAREAN SECTION      Family History  Problem Relation Age of Onset   Hypertension Mother    Hypertension Father     Medications- reviewed and updated Current Outpatient Medications  Medication Sig Dispense Refill   acetaminophen (TYLENOL) 500 MG tablet Take 1,000-2,000 mg by mouth daily.     amLODipine (NORVASC) 10 MG tablet Take 1 tablet (10 mg total) by mouth daily. 90 tablet 3   Blood Pressure Monitoring (OMRON 3 SERIES BP MONITOR) DEVI Use as directed. 1 each 0   diclofenac (VOLTAREN) 75 MG EC tablet Take 1 tablet (75 mg total) by mouth 2 (two) times daily. 30 tablet 0   famotidine (PEPCID) 10 MG tablet Take 10 mg by mouth daily. OTC     MELATONIN PO Take by mouth.     tirzepatide (MOUNJARO) 15 MG/0.5ML Pen Inject 15 mg into the skin once a week. 6 mL 1   No current facility-administered medications for this visit.    Allergies-reviewed and updated No Known Allergies  Social History   Socioeconomic History   Marital status: Married    Spouse name: Not on file   Number of children: Not on file   Years of education: Not on file   Highest education level: Not on file  Occupational History   Not on file  Tobacco Use   Smoking status: Never   Smokeless tobacco: Never  Vaping Use   Vaping status: Never Used  Substance and Sexual Activity   Alcohol use: Yes    Comment: occ   Drug use: Never   Sexual activity: Yes    Partners: Male    Birth control/protection: Condom  Other Topics Concern   Not on file  Social History Narrative   Not on file   Social Determinants of Health   Financial Resource Strain: Not on file  Food Insecurity: Not on file  Transportation Needs: Not on file  Physical Activity: Not on file  Stress: Not on file  Social Connections: Not on file        Objective:  Physical Exam: BP 130/85   Pulse 75   Temp 98 F (36.7 C) (Temporal)   Ht 5' 6.5" (1.689 m)   Wt 240 lb 12.8 oz (109.2 kg)   SpO2 100%    BMI 38.28 kg/m   Body mass index is 38.28 kg/m. Wt Readings from Last 3 Encounters:  03/24/23 240 lb 12.8 oz (109.2 kg)  09/20/22 278 lb 12.8 oz (126.5 kg)  06/21/22 (!) 300 lb 12.8 oz (136.4 kg)   Gen: NAD, resting comfortably HEENT: TMs normal bilaterally. OP clear. No thyromegaly noted.  CV: RRR with no murmurs appreciated Pulm: NWOB, CTAB with no crackles, wheezes, or rhonchi GI: Normal bowel sounds present. Soft, Nontender, Nondistended. MSK: no edema, cyanosis, or clubbing noted - Knee: Abrasion noted approximately 3 x 3 cm on left anterior knee.  Full range of motion.  Minimal ecchymosis and effusion noted.  Full range of motion.  Healthy granulation tissue noted abrasion - Ankle: No deformities.  Tenderness to palpation along lateral malleoli.  Antalgic gait  noted. Skin: warm, dry Neuro: CN2-12 grossly intact. Strength 5/5 in upper and lower extremities. Reflexes symmetric and intact bilaterally.  Psych: Normal affect and thought content     Noni Stonesifer M. Jimmey Ralph, MD 03/24/2023 2:07 PM

## 2023-03-24 NOTE — Patient Instructions (Signed)
It was very nice to see you today!  Keep up the great work!  We will check blood work today.   We will get an xray of your ankle and knee.   Return in about 6 months (around 09/24/2023) for Follow Up.   Take care, Dr Jimmey Ralph  PLEASE NOTE:  If you had any lab tests, please let us know if you have not heard back within a few days. You may see your results on mychart before we have a chance to review them but we will give you a call once they are reviewed by [place].   If we ordered any referrals today, please let us know if you have not heard from their office within the next week.   If you had any urgent prescriptions sent in today, please check with the pharmacy within an hour of our visit to make sure the prescription was transmitted appropriately.   Please try these tips to maintain a healthy lifestyle:  Eat at least 3 REAL meals and 1-2 snacks per day.  Aim for no more than 5 hours between eating.  If you eat breakfast, please do so within one hour of getting up.   Each meal should contain half fruits/vegetables, one quarter protein, and one quarter carbs (no bigger than a computer mouse)  Cut down on sweet beverages. This includes juice, soda, and sweet tea.   Drink at least 1 glass of water with each meal and aim for at least 8 glasses per day  Exercise at least 150 minutes every week.   Preventive Care 27-48 Years Old, Female Preventive care refers to lifestyle choices and visits with your health care provider that can promote health and wellness. Preventive care visits are also called wellness exams. What can I expect for my preventive care visit? Counseling Your health care provider may ask you questions about your: Medical history, including: Past medical problems. Family medical history. Pregnancy history. Current health, including: Menstrual cycle. Method of birth control. Emotional well-being. Home life and relationship well-being. Sexual activity and sexual  health. Lifestyle, including: Alcohol, nicotine or tobacco, and drug use. Access to firearms. Diet, exercise, and sleep habits. Work and work Astronomer. Sunscreen use. Safety issues such as seatbelt and bike helmet use. Physical exam Your health care provider will check your: Height and weight. These may be used to calculate your BMI (body mass index). BMI is a measurement that tells if you are at a healthy weight. Waist circumference. This measures the distance around your waistline. This measurement also tells if you are at a healthy weight and may help predict your risk of certain diseases, such as type 2 diabetes and high blood pressure. Heart rate and blood pressure. Body temperature. Skin for abnormal spots. What immunizations do I need?  Vaccines are usually given at various ages, according to a schedule. Your health care provider will recommend vaccines for you based on your age, medical history, and lifestyle or other factors, such as travel or where you work. What tests do I need? Screening Your health care provider may recommend screening tests for certain conditions. This may include: Lipid and cholesterol levels. Diabetes screening. This is done by checking your blood sugar (glucose) after you have not eaten for a while (fasting). Pelvic exam and Pap test. Hepatitis B test. Hepatitis C test. HIV (human immunodeficiency virus) test. STI (sexually transmitted infection) testing, if you are at risk. Lung cancer screening. Colorectal cancer screening. Mammogram. Talk with your health care provider about  when you should start having regular mammograms. This may depend on whether you have a family history of breast cancer. BRCA-related cancer screening. This may be done if you have a family history of breast, ovarian, tubal, or peritoneal cancers. Bone density scan. This is done to screen for osteoporosis. Talk with your health care provider about your test results,  treatment options, and if necessary, the need for more tests. Follow these instructions at home: Eating and drinking  Eat a diet that includes fresh fruits and vegetables, whole grains, lean protein, and low-fat dairy products. Take vitamin and mineral supplements as recommended by your health care provider. Do not drink alcohol if: Your health care provider tells you not to drink. You are pregnant, may be pregnant, or are planning to become pregnant. If you drink alcohol: Limit how much you have to 0-1 drink a day. Know how much alcohol is in your drink. In the [place] one drink equals one 12 oz bottle of beer (355 mL), one 5 oz glass of wine (148 mL), or one 1 oz glass of hard liquor (44 mL). Lifestyle Brush your teeth every morning and night with fluoride toothpaste. Floss one time each day. Exercise for at least 30 minutes 5 or more days each week. Do not use any products that contain nicotine or tobacco. These products include cigarettes, chewing tobacco, and vaping devices, such as e-cigarettes. If you need help quitting, ask your health care provider. Do not use drugs. If you are sexually active, practice safe sex. Use a condom or other form of protection to prevent STIs. If you do not wish to become pregnant, use a form of birth control. If you plan to become pregnant, see your health care provider for a prepregnancy visit. Take aspirin only as told by your health care provider. Make sure that you understand how much to take and what form to take. Work with your health care provider to find out whether it is safe and beneficial for you to take aspirin daily. Find healthy ways to manage stress, such as: Meditation, yoga, or listening to music. Journaling. Talking to a trusted person. Spending time with friends and family. Minimize exposure to UV radiation to reduce your risk of skin cancer. Safety Always wear your seat belt while driving or riding in a vehicle. Do not drive: If you  have been drinking alcohol. Do not ride with someone who has been drinking. When you are tired or distracted. While texting. If you have been using any mind-altering substances or drugs. Wear a helmet and other protective equipment during sports activities. If you have firearms in your house, make sure you follow all gun safety procedures. Seek help if you have been physically or sexually abused. What's next? Visit your health care provider once a year for an annual wellness visit. Ask your health care provider how often you should have your eyes and teeth checked. Stay up to date on all vaccines. This information is not intended to replace advice given to you by your health care provider. Make sure you discuss any questions you have with your health care provider. Document Revised: 02/07/2021 Document Reviewed: 02/07/2021 Elsevier Patient Education  2024 [employer].

## 2023-03-24 NOTE — Assessment & Plan Note (Signed)
Check iron.  She has had more cold intolerance lately.  Discussed with patient this may be due to her weight loss over the last several months however still check labs today.

## 2023-03-24 NOTE — Assessment & Plan Note (Signed)
Blood pressure at goal today on amlodipine 10 mg daily. 

## 2023-03-24 NOTE — Assessment & Plan Note (Signed)
Patient down 40 pounds since our last visit.  Congratulated on her weight loss.  She will continue with Mounjaro 15 mg once weekly.  She is tolerating well.  Recheck again in 6 months or so.

## 2023-03-24 NOTE — Assessment & Plan Note (Signed)
Check A1c with labs.  She is on Mounjaro 15 mg weekly.  Tolerating well.  She is also working on diet and exercise.

## 2023-03-25 NOTE — Progress Notes (Signed)
A1c looks great at 5.3.  Her iron is a little low.  Recommend she take ferrous sulfate 65 mg every other day if she is not doing so already.  Her blood counts are all normal.  Cholesterol is better than last time that we checked a year ago.  One of her liver numbers was up slightly.  This is very mild and should not cause any issues we can recheck next time she comes in for an office visit.  Do not need to make any changes to treatment plan at this time.  Will see her back in 6 months.  She should get back sooner if needed.

## 2023-03-25 NOTE — Progress Notes (Signed)
Her xrays do not show any fracture. She should continue with the treatment plan we discussed and she should let us know if not improving over the next week or two.  Linda [name]. Jimmey Ralph, MD 03/25/2023 7:21 AM

## 2023-03-31 ENCOUNTER — Other Ambulatory Visit (HOSPITAL_BASED_OUTPATIENT_CLINIC_OR_DEPARTMENT_OTHER): Payer: Self-pay

## 2023-03-31 ENCOUNTER — Other Ambulatory Visit: Payer: Self-pay | Admitting: Family Medicine

## 2023-03-31 MED ORDER — DICLOFENAC SODIUM 75 MG PO TBEC
75.0000 mg | DELAYED_RELEASE_TABLET | Freq: Two times a day (BID) | ORAL | 0 refills | Status: DC
  Filled 2023-03-31: qty 30, 15d supply, fill #0

## 2023-03-31 NOTE — Progress Notes (Signed)
She does not need to double up. Her levels are on the lower range but her ferritin was better than last time we checked. She can continue current dose and we can recheck again  in 3-6 months.  Linda [name]. Jimmey Ralph, MD 03/31/2023 7:30 AM

## 2023-04-18 ENCOUNTER — Other Ambulatory Visit (HOSPITAL_COMMUNITY): Payer: Self-pay

## 2023-04-25 ENCOUNTER — Other Ambulatory Visit: Payer: Self-pay

## 2023-04-25 ENCOUNTER — Other Ambulatory Visit (HOSPITAL_BASED_OUTPATIENT_CLINIC_OR_DEPARTMENT_OTHER): Payer: Self-pay

## 2023-04-25 ENCOUNTER — Other Ambulatory Visit: Payer: Self-pay | Admitting: Family Medicine

## 2023-04-25 MED ORDER — DICLOFENAC SODIUM 75 MG PO TBEC
75.0000 mg | DELAYED_RELEASE_TABLET | Freq: Two times a day (BID) | ORAL | 0 refills | Status: DC
  Filled 2023-04-25 – 2023-04-26 (×2): qty 30, 15d supply, fill #0

## 2023-04-26 ENCOUNTER — Other Ambulatory Visit (HOSPITAL_BASED_OUTPATIENT_CLINIC_OR_DEPARTMENT_OTHER): Payer: Self-pay

## 2023-04-29 ENCOUNTER — Other Ambulatory Visit: Payer: Self-pay

## 2023-04-29 ENCOUNTER — Other Ambulatory Visit (HOSPITAL_BASED_OUTPATIENT_CLINIC_OR_DEPARTMENT_OTHER): Payer: Self-pay

## 2023-05-25 ENCOUNTER — Other Ambulatory Visit: Payer: Self-pay | Admitting: Family Medicine

## 2023-05-25 ENCOUNTER — Other Ambulatory Visit (HOSPITAL_BASED_OUTPATIENT_CLINIC_OR_DEPARTMENT_OTHER): Payer: Self-pay

## 2023-05-26 ENCOUNTER — Other Ambulatory Visit (HOSPITAL_BASED_OUTPATIENT_CLINIC_OR_DEPARTMENT_OTHER): Payer: Self-pay

## 2023-05-26 MED ORDER — DICLOFENAC SODIUM 75 MG PO TBEC
75.0000 mg | DELAYED_RELEASE_TABLET | Freq: Two times a day (BID) | ORAL | 0 refills | Status: DC
  Filled 2023-05-26: qty 30, 15d supply, fill #0

## 2023-05-30 ENCOUNTER — Other Ambulatory Visit (HOSPITAL_BASED_OUTPATIENT_CLINIC_OR_DEPARTMENT_OTHER): Payer: Self-pay

## 2023-05-31 ENCOUNTER — Other Ambulatory Visit (HOSPITAL_BASED_OUTPATIENT_CLINIC_OR_DEPARTMENT_OTHER): Payer: Self-pay

## 2023-06-22 ENCOUNTER — Other Ambulatory Visit: Payer: Self-pay | Admitting: Family Medicine

## 2023-06-23 ENCOUNTER — Other Ambulatory Visit: Payer: Self-pay

## 2023-06-23 ENCOUNTER — Other Ambulatory Visit (HOSPITAL_BASED_OUTPATIENT_CLINIC_OR_DEPARTMENT_OTHER): Payer: Self-pay

## 2023-06-23 MED ORDER — DICLOFENAC SODIUM 75 MG PO TBEC
75.0000 mg | DELAYED_RELEASE_TABLET | Freq: Two times a day (BID) | ORAL | 0 refills | Status: DC
  Filled 2023-06-23: qty 30, 15d supply, fill #0

## 2023-07-25 ENCOUNTER — Other Ambulatory Visit: Payer: Self-pay

## 2023-07-25 ENCOUNTER — Other Ambulatory Visit: Payer: Self-pay | Admitting: Family Medicine

## 2023-07-28 ENCOUNTER — Other Ambulatory Visit (HOSPITAL_BASED_OUTPATIENT_CLINIC_OR_DEPARTMENT_OTHER): Payer: Self-pay

## 2023-07-28 MED ORDER — DICLOFENAC SODIUM 75 MG PO TBEC
75.0000 mg | DELAYED_RELEASE_TABLET | Freq: Two times a day (BID) | ORAL | 0 refills | Status: DC
  Filled 2023-07-28: qty 30, 15d supply, fill #0

## 2023-08-31 ENCOUNTER — Other Ambulatory Visit: Payer: Self-pay | Admitting: Family Medicine

## 2023-09-01 ENCOUNTER — Other Ambulatory Visit (HOSPITAL_BASED_OUTPATIENT_CLINIC_OR_DEPARTMENT_OTHER): Payer: Self-pay

## 2023-09-01 ENCOUNTER — Other Ambulatory Visit: Payer: Self-pay

## 2023-09-01 MED ORDER — DICLOFENAC SODIUM 75 MG PO TBEC
75.0000 mg | DELAYED_RELEASE_TABLET | Freq: Two times a day (BID) | ORAL | 0 refills | Status: DC
  Filled 2023-09-01: qty 30, 15d supply, fill #0

## 2023-09-01 MED ORDER — MOUNJARO 15 MG/0.5ML ~~LOC~~ SOAJ
15.0000 mg | SUBCUTANEOUS | 1 refills | Status: DC
  Filled 2023-09-01: qty 6, 84d supply, fill #0
  Filled 2023-11-27: qty 6, 84d supply, fill #1

## 2023-09-26 ENCOUNTER — Encounter: Payer: Self-pay | Admitting: Family Medicine

## 2023-09-26 ENCOUNTER — Ambulatory Visit: Payer: 59 | Admitting: Family Medicine

## 2023-09-26 DIAGNOSIS — Z7985 Long-term (current) use of injectable non-insulin antidiabetic drugs: Secondary | ICD-10-CM | POA: Diagnosis not present

## 2023-09-26 DIAGNOSIS — E1165 Type 2 diabetes mellitus with hyperglycemia: Secondary | ICD-10-CM | POA: Diagnosis not present

## 2023-09-26 DIAGNOSIS — I10 Essential (primary) hypertension: Secondary | ICD-10-CM

## 2023-09-26 DIAGNOSIS — Z6833 Body mass index (BMI) 33.0-33.9, adult: Secondary | ICD-10-CM | POA: Diagnosis not present

## 2023-09-26 LAB — POCT GLYCOSYLATED HEMOGLOBIN (HGB A1C): Hemoglobin A1C: 4.8 % (ref 4.0–5.6)

## 2023-09-26 NOTE — Assessment & Plan Note (Signed)
Blood pressure at goal on amlodipine 10 mg daily.

## 2023-09-26 NOTE — Patient Instructions (Signed)
It was very nice to see you today!  Your Hemoglobin A1c is 4.8.  Keep up the great work!  Return in about 6 months (around 03/25/2024) for Annual Physical.   Take care, Dr Jimmey Ralph  PLEASE NOTE:  If you had any lab tests, please let us know if you have not heard back within a few days. You may see your results on mychart before we have a chance to review them but we will give you a call once they are reviewed by [place].   If we ordered any referrals today, please let us know if you have not heard from their office within the next week.   If you had any urgent prescriptions sent in today, please check with the pharmacy within an hour of our visit to make sure the prescription was transmitted appropriately.   Please try these tips to maintain a healthy lifestyle:  Eat at least 3 REAL meals and 1-2 snacks per day.  Aim for no more than 5 hours between eating.  If you eat breakfast, please do so within one hour of getting up.   Each meal should contain half fruits/vegetables, one quarter protein, and one quarter carbs (no bigger than a computer mouse)  Cut down on sweet beverages. This includes juice, soda, and sweet tea.   Drink at least 1 glass of water with each meal and aim for at least 8 glasses per day  Exercise at least 150 minutes every week.

## 2023-09-26 NOTE — Progress Notes (Signed)
   Linda [name] is a 43 y.o. female who presents today for an office visit.  Assessment/Plan:  Chronic Problems Addressed Today: T2DM (type 2 diabetes mellitus) (HCC) A1c well-controlled at 4.8.  She is working on diet and exercise.  We will continue Mounjaro 15 mg weekly.  Recheck in 6 months at CPE.  Morbid obesity (HCC) Patient is down 35 pounds since her last visit.  Congratulated her on weight loss.  She is working on diet and exercise.  We will continue Mounjaro 15 mg weekly as above.  Recheck again in 6 months at CPE.  Patient states her goal weight is a little under 200 pounds.  She may be interested in decreasing dose at some point the future for maintenance however we will continue with current dose for now as above.  Essential hypertension Blood pressure at goal on amlodipine 10 mg daily.     Subjective:  HPI:  See Assessment / plan for status of chronic conditions.  Patient is here for 57-month follow-up.  Saw her 6 months ago for annual physical.  At that time A1c stable at 5.3 on Mounjaro 15 mg weekly.  She has done well last several months.  She is continue to work on diet.  She has no specific exercise regimen that was very active with work.       Objective:  Physical Exam: BP 120/84 (BP Location: Left Arm, Patient Position: Sitting)   Pulse 80   Temp (!) 97.2 F (36.2 C) (Temporal)   Ht 5\' 6"  (1.676 m)   Wt 205 lb (93 kg) Comment: Pt reported/ refused weight check  SpO2 97%   BMI 33.09 kg/m   Wt Readings from Last 3 Encounters:  09/26/23 205 lb (93 kg)  03/24/23 240 lb 12.8 oz (109.2 kg)  09/20/22 278 lb 12.8 oz (126.5 kg)    Gen: No acute distress, resting comfortably CV: Regular rate and rhythm with no murmurs appreciated Pulm: Normal work of breathing, clear to auscultation bilaterally with no crackles, wheezes, or rhonchi Neuro: Grossly normal, moves all extremities Psych: Normal affect and thought content      Cayley Pester M. Jimmey Ralph,  MD 09/26/2023 7:52 AM

## 2023-09-26 NOTE — Assessment & Plan Note (Signed)
Patient is down 35 pounds since her last visit.  Congratulated her on weight loss.  She is working on diet and exercise.  We will continue Mounjaro 15 mg weekly as above.  Recheck again in 6 months at CPE.  Patient states her goal weight is a little under 200 pounds.  She may be interested in decreasing dose at some point the future for maintenance however we will continue with current dose for now as above.

## 2023-09-26 NOTE — Assessment & Plan Note (Signed)
A1c well-controlled at 4.8.  She is working on diet and exercise.  We will continue Mounjaro 15 mg weekly.  Recheck in 6 months at CPE.

## 2023-10-07 ENCOUNTER — Other Ambulatory Visit (HOSPITAL_BASED_OUTPATIENT_CLINIC_OR_DEPARTMENT_OTHER): Payer: Self-pay

## 2023-10-07 ENCOUNTER — Other Ambulatory Visit: Payer: Self-pay | Admitting: Family Medicine

## 2023-10-07 MED ORDER — DICLOFENAC SODIUM 75 MG PO TBEC
75.0000 mg | DELAYED_RELEASE_TABLET | Freq: Two times a day (BID) | ORAL | 0 refills | Status: DC
  Filled 2023-10-07: qty 30, 15d supply, fill #0

## 2023-11-09 ENCOUNTER — Other Ambulatory Visit: Payer: Self-pay | Admitting: Family Medicine

## 2023-11-10 ENCOUNTER — Other Ambulatory Visit (HOSPITAL_BASED_OUTPATIENT_CLINIC_OR_DEPARTMENT_OTHER): Payer: Self-pay

## 2023-11-10 MED ORDER — DICLOFENAC SODIUM 75 MG PO TBEC
75.0000 mg | DELAYED_RELEASE_TABLET | Freq: Two times a day (BID) | ORAL | 0 refills | Status: DC
  Filled 2023-11-10: qty 30, 15d supply, fill #0

## 2023-11-27 ENCOUNTER — Other Ambulatory Visit: Payer: Self-pay

## 2023-11-27 ENCOUNTER — Other Ambulatory Visit (HOSPITAL_BASED_OUTPATIENT_CLINIC_OR_DEPARTMENT_OTHER): Payer: Self-pay

## 2023-11-27 ENCOUNTER — Other Ambulatory Visit: Payer: Self-pay | Admitting: Family Medicine

## 2023-11-27 MED ORDER — DICLOFENAC SODIUM 75 MG PO TBEC
75.0000 mg | DELAYED_RELEASE_TABLET | Freq: Two times a day (BID) | ORAL | 0 refills | Status: DC
  Filled 2023-11-27: qty 30, 15d supply, fill #0

## 2023-12-19 ENCOUNTER — Other Ambulatory Visit (HOSPITAL_BASED_OUTPATIENT_CLINIC_OR_DEPARTMENT_OTHER): Payer: Self-pay | Admitting: Family Medicine

## 2023-12-19 DIAGNOSIS — Z1231 Encounter for screening mammogram for malignant neoplasm of breast: Secondary | ICD-10-CM

## 2023-12-22 ENCOUNTER — Ambulatory Visit (HOSPITAL_BASED_OUTPATIENT_CLINIC_OR_DEPARTMENT_OTHER)
Admission: RE | Admit: 2023-12-22 | Discharge: 2023-12-22 | Disposition: A | Discharge status: Home / Self Care | Source: Ambulatory Visit | Attending: Family Medicine | Admitting: Family Medicine

## 2023-12-22 ENCOUNTER — Encounter (HOSPITAL_BASED_OUTPATIENT_CLINIC_OR_DEPARTMENT_OTHER): Payer: Self-pay | Admitting: Radiology

## 2023-12-22 DIAGNOSIS — Z1231 Encounter for screening mammogram for malignant neoplasm of breast: Secondary | ICD-10-CM | POA: Diagnosis not present

## 2024-01-28 ENCOUNTER — Other Ambulatory Visit: Payer: Self-pay | Admitting: Family Medicine

## 2024-01-28 ENCOUNTER — Other Ambulatory Visit (HOSPITAL_BASED_OUTPATIENT_CLINIC_OR_DEPARTMENT_OTHER): Payer: Self-pay

## 2024-01-28 DIAGNOSIS — I10 Essential (primary) hypertension: Secondary | ICD-10-CM

## 2024-01-28 MED ORDER — MOUNJARO 15 MG/0.5ML ~~LOC~~ SOAJ
15.0000 mg | SUBCUTANEOUS | 1 refills | Status: DC
  Filled 2024-01-28 – 2024-02-15 (×2): qty 6, 84d supply, fill #0
  Filled 2024-06-14: qty 6, 84d supply, fill #1

## 2024-01-28 MED ORDER — AMLODIPINE BESYLATE 10 MG PO TABS
10.0000 mg | ORAL_TABLET | Freq: Every day | ORAL | 3 refills | Status: AC
  Filled 2024-01-28 – 2024-02-15 (×2): qty 90, 90d supply, fill #0
  Filled 2024-06-14: qty 90, 90d supply, fill #1
  Filled 2024-09-02: qty 90, 90d supply, fill #2

## 2024-01-28 MED ORDER — DICLOFENAC SODIUM 75 MG PO TBEC
75.0000 mg | DELAYED_RELEASE_TABLET | Freq: Two times a day (BID) | ORAL | 0 refills | Status: DC
  Filled 2024-01-28: qty 30, 15d supply, fill #0

## 2024-02-15 ENCOUNTER — Other Ambulatory Visit: Payer: Self-pay | Admitting: Family Medicine

## 2024-02-16 ENCOUNTER — Other Ambulatory Visit (HOSPITAL_BASED_OUTPATIENT_CLINIC_OR_DEPARTMENT_OTHER): Payer: Self-pay

## 2024-02-16 MED ORDER — DICLOFENAC SODIUM 75 MG PO TBEC
75.0000 mg | DELAYED_RELEASE_TABLET | Freq: Two times a day (BID) | ORAL | 0 refills | Status: DC
  Filled 2024-02-16: qty 30, 15d supply, fill #0

## 2024-02-20 ENCOUNTER — Other Ambulatory Visit: Payer: Self-pay

## 2024-03-29 ENCOUNTER — Other Ambulatory Visit: Payer: Self-pay | Admitting: Family Medicine

## 2024-03-29 ENCOUNTER — Encounter: Payer: 59 | Admitting: Family Medicine

## 2024-03-29 ENCOUNTER — Other Ambulatory Visit (HOSPITAL_BASED_OUTPATIENT_CLINIC_OR_DEPARTMENT_OTHER): Payer: Self-pay

## 2024-03-29 MED ORDER — DICLOFENAC SODIUM 75 MG PO TBEC
75.0000 mg | DELAYED_RELEASE_TABLET | Freq: Two times a day (BID) | ORAL | 0 refills | Status: DC
  Filled 2024-03-29: qty 30, 15d supply, fill #0

## 2024-05-03 ENCOUNTER — Other Ambulatory Visit (HOSPITAL_BASED_OUTPATIENT_CLINIC_OR_DEPARTMENT_OTHER): Payer: Self-pay

## 2024-05-03 ENCOUNTER — Other Ambulatory Visit: Payer: Self-pay | Admitting: Family Medicine

## 2024-05-03 MED ORDER — DICLOFENAC SODIUM 75 MG PO TBEC
75.0000 mg | DELAYED_RELEASE_TABLET | Freq: Two times a day (BID) | ORAL | 0 refills | Status: DC
  Filled 2024-05-03 – 2024-05-18 (×2): qty 30, 15d supply, fill #0

## 2024-05-13 ENCOUNTER — Other Ambulatory Visit (HOSPITAL_BASED_OUTPATIENT_CLINIC_OR_DEPARTMENT_OTHER): Payer: Self-pay

## 2024-05-18 ENCOUNTER — Other Ambulatory Visit (HOSPITAL_BASED_OUTPATIENT_CLINIC_OR_DEPARTMENT_OTHER): Payer: Self-pay

## 2024-06-14 ENCOUNTER — Other Ambulatory Visit (HOSPITAL_BASED_OUTPATIENT_CLINIC_OR_DEPARTMENT_OTHER): Payer: Self-pay

## 2024-06-14 ENCOUNTER — Other Ambulatory Visit: Payer: Self-pay | Admitting: Family Medicine

## 2024-06-14 ENCOUNTER — Other Ambulatory Visit: Payer: Self-pay

## 2024-06-14 MED ORDER — DICLOFENAC SODIUM 75 MG PO TBEC
75.0000 mg | DELAYED_RELEASE_TABLET | Freq: Two times a day (BID) | ORAL | 0 refills | Status: DC
  Filled 2024-06-14: qty 30, 15d supply, fill #0

## 2024-06-17 ENCOUNTER — Encounter: Payer: Self-pay | Admitting: Family Medicine

## 2024-06-17 ENCOUNTER — Ambulatory Visit: Admitting: Family Medicine

## 2024-06-17 VITALS — BP 138/88 | HR 85 | Temp 97.3°F | Ht 66.0 in | Wt 203.0 lb

## 2024-06-17 DIAGNOSIS — Z124 Encounter for screening for malignant neoplasm of cervix: Secondary | ICD-10-CM

## 2024-06-17 DIAGNOSIS — Z7985 Long-term (current) use of injectable non-insulin antidiabetic drugs: Secondary | ICD-10-CM | POA: Diagnosis not present

## 2024-06-17 DIAGNOSIS — E1165 Type 2 diabetes mellitus with hyperglycemia: Secondary | ICD-10-CM | POA: Diagnosis not present

## 2024-06-17 DIAGNOSIS — Z23 Encounter for immunization: Secondary | ICD-10-CM

## 2024-06-17 DIAGNOSIS — Z1211 Encounter for screening for malignant neoplasm of colon: Secondary | ICD-10-CM

## 2024-06-17 DIAGNOSIS — Z0001 Encounter for general adult medical examination with abnormal findings: Secondary | ICD-10-CM

## 2024-06-17 DIAGNOSIS — I10 Essential (primary) hypertension: Secondary | ICD-10-CM | POA: Diagnosis not present

## 2024-06-17 DIAGNOSIS — D509 Iron deficiency anemia, unspecified: Secondary | ICD-10-CM

## 2024-06-17 DIAGNOSIS — Z13220 Encounter for screening for lipoid disorders: Secondary | ICD-10-CM | POA: Diagnosis not present

## 2024-06-17 LAB — LIPID PANEL
Cholesterol: 196 mg/dL (ref 0–200)
HDL: 55.6 mg/dL (ref >39.00)
LDL Cholesterol: 133 mg/dL — ABNORMAL HIGH (ref 0–99)
NonHDL: 140.14
Total CHOL/HDL Ratio: 4
Triglycerides: 36 mg/dL (ref 0.0–149.0)
VLDL: 7.2 mg/dL (ref 0.0–40.0)

## 2024-06-17 LAB — CBC
HCT: 42.1 % (ref 36.0–46.0)
Hemoglobin: 13.5 g/dL (ref 12.0–15.0)
MCHC: 32.1 g/dL (ref 30.0–36.0)
MCV: 82.6 fl (ref 78.0–100.0)
Platelets: 251 K/uL (ref 150.0–400.0)
RBC: 5.09 Mil/uL (ref 3.87–5.11)
RDW: 14.3 % (ref 11.5–15.5)
WBC: 3.8 K/uL — ABNORMAL LOW (ref 4.0–10.5)

## 2024-06-17 LAB — IBC + FERRITIN
Ferritin: 27.9 ng/mL (ref 10.0–291.0)
Iron: 69 ug/dL (ref 42–145)
Saturation Ratios: 23.8 % (ref 20.0–50.0)
TIBC: 289.8 ug/dL (ref 250.0–450.0)
Transferrin: 207 mg/dL — ABNORMAL LOW (ref 212.0–360.0)

## 2024-06-17 LAB — MICROALBUMIN / CREATININE URINE RATIO
Creatinine,U: 221.8 mg/dL
Microalb Creat Ratio: 5.2 mg/g (ref 0.0–30.0)
Microalb, Ur: 1.1 mg/dL (ref 0.0–1.9)

## 2024-06-17 LAB — COMPREHENSIVE METABOLIC PANEL WITH GFR
ALT: 30 U/L (ref 0–35)
AST: 19 U/L (ref 0–37)
Albumin: 3.9 g/dL (ref 3.5–5.2)
Alkaline Phosphatase: 55 U/L (ref 39–117)
BUN: 9 mg/dL (ref 6–23)
CO2: 24 meq/L (ref 19–32)
Calcium: 8.6 mg/dL (ref 8.4–10.5)
Chloride: 106 meq/L (ref 96–112)
Creatinine, Ser: 0.7 mg/dL (ref 0.40–1.20)
GFR: 107.12 mL/min (ref >60.00)
Glucose, Bld: 73 mg/dL (ref 70–99)
Potassium: 3.6 meq/L (ref 3.5–5.1)
Sodium: 139 meq/L (ref 135–145)
Total Bilirubin: 0.4 mg/dL (ref 0.2–1.2)
Total Protein: 7.1 g/dL (ref 6.0–8.3)

## 2024-06-17 LAB — VITAMIN B12: Vitamin B-12: 727 pg/mL (ref 211–911)

## 2024-06-17 LAB — HEMOGLOBIN A1C: Hgb A1c MFr Bld: 5.2 % (ref 4.6–6.5)

## 2024-06-17 LAB — TSH: TSH: 1.81 u[IU]/mL (ref 0.35–5.50)

## 2024-06-17 NOTE — Addendum Note (Signed)
 Addended by: IDA ELORA HERO on: 06/17/2024 08:18 AM   Modules accepted: Orders

## 2024-06-17 NOTE — Assessment & Plan Note (Signed)
 Check CBC and iron panel.

## 2024-06-17 NOTE — Assessment & Plan Note (Signed)
Blood pressure at goal on amlodipine 10 mg daily.

## 2024-06-17 NOTE — Assessment & Plan Note (Signed)
 Doing very well with Mounjaro  15 mg weekly.  She did have some constipation but this is now manageable with MiraLAX.  Check labs today.

## 2024-06-17 NOTE — Progress Notes (Signed)
 Chief Complaint:  Linda [name] is a 43 y.o. female who presents today for her annual comprehensive physical exam.    Assessment/Plan:  Chronic Problems Addressed Today: Essential hypertension Blood pressure at goal on amlodipine  10 mg daily.  Iron deficiency anemia Check CBC and iron panel.   Morbid obesity (HCC) She is doing very well with this.  She has lost a few pounds since her last visit.  She is on Mounjaro  15 mg weekly for diabetes.  Check labs today.  T2DM (type 2 diabetes mellitus) (HCC) Doing very well with Mounjaro  15 mg weekly.  She did have some constipation but this is now manageable with MiraLAX.  Check labs today.  Preventative Healthcare: Pneumonia vaccine given today.  Check labs.  Will refer for Pap smear.  Patient Counseling(The following topics were reviewed and/or handout was given):  -Nutrition: Stressed importance of moderation in sodium/caffeine intake, saturated fat and cholesterol, caloric balance, sufficient intake of fresh fruits, vegetables, and fiber.  -Stressed the importance of regular exercise.   -Substance Abuse: Discussed cessation/primary prevention of tobacco, alcohol, or other drug use; driving or other dangerous activities under the influence; availability of treatment for abuse.   -Injury prevention: Discussed safety belts, safety helmets, smoke detector, smoking near bedding or upholstery.   -Sexuality: Discussed sexually transmitted diseases, partner selection, use of condoms, avoidance of unintended pregnancy and contraceptive alternatives.   -Dental health: Discussed importance of regular tooth brushing, flossing, and dental visits.  -Health maintenance and immunizations reviewed. Please refer to Health maintenance section.  Return to care in 1 year for next preventative visit.     Subjective:  HPI:  She has no acute complaints today. Patient is here today for her annual physical.  See assessment / plan for status of  chronic conditions.  Discussed the use of AI scribe software for clinical note transcription with the patient, who gave verbal consent to proceed.  History of Present Illness Linda [name] is a 43 year old female who presents for an annual physical exam.  She has been experiencing severe constipation over the past few months, describing it as 'real bad,' with episodes lasting up to a week without a bowel movement. She has been using magnesium citrate for relief but recently started taking Miralax daily, which has improved her symptoms.  She continues to take an iron supplement but reports feeling cold and is unsure if her iron levels have improved.  She has been focusing on her diet and exercise, aiming to consume plenty of fruits and vegetables. She has lost a few pounds since her last visit in January.  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 Her vision is good, and she is aware of the need for regular eye exams, although she does not have a regular optometrist. No issues with vision or hearing.      06/17/2024    7:42 AM  Depression screen PHQ 2/9  Decreased Interest 0  Down, Depressed, Hopeless 0  PHQ - 2 Score 0    Health Maintenance Due  Topic Date Due   FOOT EXAM  Never done   OPHTHALMOLOGY EXAM  Never done   Diabetic kidney evaluation - Urine ACR  Never done   Pneumococcal Vaccine (1 of 2 - PCV) Never done   HPV VACCINES (1 - 3-dose SCDM series) Never done   Diabetic kidney evaluation - eGFR measurement  03/23/2024   HEMOGLOBIN A1C  03/25/2024   Cervical Cancer Screening (HPV/Pap Cotest)  04/18/2024     ROS: Per HPI, otherwise a complete review of systems was negative.   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   Abnormal uterine bleeding 07/06/2021   Morbid obesity (HCC) 11/19/2019   Polyarthralgia 11/19/2019   T2DM (type 2 diabetes mellitus) (HCC) 11/19/2019   Left knee pain 02/08/2019   Essential hypertension 02/08/2019   Gastroesophageal reflux disease 02/08/2019   Frequent headaches 02/08/2019   Iron deficiency anemia 03/27/2017   Anemia 02/13/2017   Past Surgical History:  Procedure Laterality Date   CESAREAN SECTION      Family History  Problem Relation Age of Onset   Hypertension Mother    Hypertension Father     Medications- reviewed and updated Current Outpatient Medications  Medication Sig Dispense Refill   acetaminophen (TYLENOL) 500 MG tablet Take 1,000-2,000 mg by mouth daily.     amLODipine  (NORVASC ) 10 MG tablet Take 1 tablet (10 mg total) by mouth daily. 90 tablet 3   Blood Pressure Monitoring (OMRON 3 SERIES BP MONITOR) DEVI Use as directed. 1 each 0   diclofenac  (VOLTAREN ) 75 MG EC tablet Take 1 tablet (75 mg total) by mouth 2 (two) times daily. 30 tablet 0   famotidine (PEPCID) 10 MG tablet Take 10 mg by mouth daily. OTC     ferrous sulfate 325 (65 FE) MG EC tablet Take 325 mg by mouth daily with breakfast.     MELATONIN PO Take by mouth.     Multiple Vitamin (MULTI-VITAMIN) tablet Take 1 tablet by mouth daily.     tirzepatide  (MOUNJARO ) 15 MG/0.5ML Pen Inject 15 mg into the skin once a week. 6 mL 1   No current facility-administered medications for this visit.    Allergies-reviewed and updated No Known Allergies  Social History   Socioeconomic History   Marital status: Married    Spouse name: Not on file   Number of children: Not on file   Years of education: Not on file   Highest education level: Not on file  Occupational History   Not on file  Tobacco Use   Smoking status: Never   Smokeless tobacco: Never  Vaping Use   Vaping status: Never Used  Substance and Sexual Activity   Alcohol use: Yes    Comment: occ   Drug use: Never    Sexual activity: Yes    Partners: Male    Birth control/protection: Condom  Other Topics Concern   Not on file  Social History Narrative   Not on file   Social Drivers of Health   Financial Resource Strain: Not on file  Food Insecurity: Not on file  Transportation Needs: Not on file  Physical Activity: Not on file  Stress: Not on file  Social Connections: Not on file        Objective:  Physical Exam: BP 138/88   Pulse 85   Temp (!) 97.3 F (36.3 C) (Temporal)   Ht 5' 6 (1.676 m)   Wt 203 lb (92.1 kg)   LMP 05/26/2024   SpO2 99%   BMI 32.77 kg/m   Body mass index is 32.77 kg/m. Wt Readings from Last 3 Encounters:  06/17/24 203 lb (92.1 kg)  09/26/23 205 lb (93 kg)  03/24/23 240 lb 12.8 oz (109.2 kg)   Gen: NAD, resting comfortably HEENT: TMs normal bilaterally. OP clear. No thyromegaly noted.  CV: RRR with no murmurs appreciated Pulm: NWOB, CTAB with no crackles, wheezes, or rhonchi GI: Normal bowel sounds present. Soft, Nontender, Nondistended. MSK: no edema, cyanosis, or clubbing noted Skin: warm, dry Neuro: CN2-12 grossly intact. Strength 5/5 in upper and lower extremities. Reflexes symmetric and intact bilaterally.  Psych: Normal affect and thought content     Matheson Vandehei M. Kennyth, MD 06/17/2024 8:08 AM

## 2024-06-17 NOTE — Assessment & Plan Note (Signed)
 She is doing very well with this.  She has lost a few pounds since her last visit.  She is on Mounjaro  15 mg weekly for diabetes.  Check labs today.

## 2024-06-17 NOTE — Patient Instructions (Signed)
 It was very nice to see you today!  VISIT SUMMARY: You had your annual physical exam today. We discussed your recent constipation, diabetes management, weight loss, and other health concerns. We also planned some routine screenings and vaccinations.  YOUR PLAN: ADULT WELLNESS VISIT: This was your routine wellness visit. -You need a Pap smear and a pneumonia vaccine. -Your mammogram results were normal. -We will start colon cancer screening at age 43. -We will perform blood work to check A1c, blood counts, kidney function, liver function, iron levels, and B12. -You will receive the pneumonia vaccine today. -You will be referred to a gynecologist for a Pap smear.  TYPE 2 DIABETES MELLITUS: Your diabetes is managed with Mounjaro  15 mg weekly. -Continue taking Mounjaro  15 mg weekly. -We will check your A1c as part of your blood work.  MORBID OBESITY: You have experienced recent weight loss through diet and exercise. -Continue your current diet and exercise regimen.  ESSENTIAL HYPERTENSION: Your hypertension is managed with Amlodipine  10 mg daily. -Continue taking Amlodipine  10 mg daily.  CONSTIPATION: Your constipation is likely due to Mounjaro  and has improved with daily Miralax. -Continue taking Miralax daily.  IRON DEFICIENCY (SUSPECTED): You may have iron deficiency due to symptoms of feeling cold. -Continue taking your iron supplements. -We will check your iron levels as part of your blood work.  Return in about 1 year (around 06/17/2025) for Annual Physical.   Take care, Dr Kennyth  PLEASE NOTE:  If you had any lab tests, please let us  know if you have not heard back within a few days. You may see your results on mychart before we have a chance to review them but we will give you a call once they are reviewed by us .   If we ordered any referrals today, please let us  know if you have not heard from their office within the next week.   If you had any urgent prescriptions sent  in today, please check with the pharmacy within an hour of our visit to make sure the prescription was transmitted appropriately.   Please try these tips to maintain a healthy lifestyle:  Eat at least 3 REAL meals and 1-2 snacks per day.  Aim for no more than 5 hours between eating.  If you eat breakfast, please do so within one hour of getting up.   Each meal should contain half fruits/vegetables, one quarter protein, and one quarter carbs (no bigger than a computer mouse)  Cut down on sweet beverages. This includes juice, soda, and sweet tea.   Drink at least 1 glass of water with each meal and aim for at least 8 glasses per day  Exercise at least 150 minutes every week.     Preventive Care 35-74 Years Old, Female Preventive care refers to lifestyle choices and visits with your health care provider that can promote health and wellness. Preventive care visits are also called wellness exams. What can I expect for my preventive care visit? Counseling Your health care provider may ask you questions about your: Medical history, including: Past medical problems. Family medical history. Pregnancy history. Current health, including: Menstrual cycle. Method of birth control. Emotional well-being. Home life and relationship well-being. Sexual activity and sexual health. Lifestyle, including: Alcohol, nicotine or tobacco, and drug use. Access to firearms. Diet, exercise, and sleep habits. Work and work Astronomer. Sunscreen use. Safety issues such as seatbelt and bike helmet use. Physical exam Your health care provider will check your: Height and weight. These may be  used to calculate your BMI (body mass index). BMI is a measurement that tells if you are at a healthy weight. Waist circumference. This measures the distance around your waistline. This measurement also tells if you are at a healthy weight and may help predict your risk of certain diseases, such as type 2 diabetes and  high blood pressure. Heart rate and blood pressure. Body temperature. Skin for abnormal spots. What immunizations do I need?  Vaccines are usually given at various ages, according to a schedule. Your health care provider will recommend vaccines for you based on your age, medical history, and lifestyle or other factors, such as travel or where you work. What tests do I need? Screening Your health care provider may recommend screening tests for certain conditions. This may include: Lipid and cholesterol levels. Diabetes screening. This is done by checking your blood sugar (glucose) after you have not eaten for a while (fasting). Pelvic exam and Pap test. Hepatitis B test. Hepatitis C test. HIV (human immunodeficiency virus) test. STI (sexually transmitted infection) testing, if you are at risk. Lung cancer screening. Colorectal cancer screening. Mammogram. Talk with your health care provider about when you should start having regular mammograms. This may depend on whether you have a family history of breast cancer. BRCA-related cancer screening. This may be done if you have a family history of breast, ovarian, tubal, or peritoneal cancers. Bone density scan. This is done to screen for osteoporosis. Talk with your health care provider about your test results, treatment options, and if necessary, the need for more tests. Follow these instructions at home: Eating and drinking  Eat a diet that includes fresh fruits and vegetables, whole grains, lean protein, and low-fat dairy products. Take vitamin and mineral supplements as recommended by your health care provider. Do not drink alcohol if: Your health care provider tells you not to drink. You are pregnant, may be pregnant, or are planning to become pregnant. If you drink alcohol: Limit how much you have to 0-1 drink a day. Know how much alcohol is in your drink. In the [place] one drink equals one 12 oz bottle of beer (355 mL), one 5 oz  glass of wine (148 mL), or one 1 oz glass of hard liquor (44 mL). Lifestyle Brush your teeth every morning and night with fluoride toothpaste. Floss one time each day. Exercise for at least 30 minutes 5 or more days each week. Do not use any products that contain nicotine or tobacco. These products include cigarettes, chewing tobacco, and vaping devices, such as e-cigarettes. If you need help quitting, ask your health care provider. Do not use drugs. If you are sexually active, practice safe sex. Use a condom or other form of protection to prevent STIs. If you do not wish to become pregnant, use a form of birth control. If you plan to become pregnant, see your health care provider for a prepregnancy visit. Take aspirin only as told by your health care provider. Make sure that you understand how much to take and what form to take. Work with your health care provider to find out whether it is safe and beneficial for you to take aspirin daily. Find healthy ways to manage stress, such as: Meditation, yoga, or listening to music. Journaling. Talking to a trusted person. Spending time with friends and family. Minimize exposure to UV radiation to reduce your risk of skin cancer. Safety Always wear your seat belt while driving or riding in a vehicle. Do not drive: If  you have been drinking alcohol. Do not ride with someone who has been drinking. When you are tired or distracted. While texting. If you have been using any mind-altering substances or drugs. Wear a helmet and other protective equipment during sports activities. If you have firearms in your house, make sure you follow all gun safety procedures. Seek help if you have been physically or sexually abused. What's next? Visit your health care provider once a year for an annual wellness visit. Ask your health care provider how often you should have your eyes and teeth checked. Stay up to date on all vaccines. This information is not  intended to replace advice given to you by your health care provider. Make sure you discuss any questions you have with your health care provider. Document Revised: 02/07/2021 Document Reviewed: 02/07/2021 Elsevier Patient Education  2024 [employer].

## 2024-06-18 ENCOUNTER — Ambulatory Visit: Payer: Self-pay | Admitting: Family Medicine

## 2024-06-18 DIAGNOSIS — D72819 Decreased white blood cell count, unspecified: Secondary | ICD-10-CM

## 2024-06-18 NOTE — Progress Notes (Signed)
 White blood cell count is mildly decreased.  Recommend she come back in 4 weeks to recheck.  LDL is up a little bit compared to last year however her HDL is up as well.  Do not need to start meds but she should continue with her lifestyle interventions and we can recheck again in a year.  Her iron levels are at goal.  A1c is very well-controlled at 5.2.  All of her other blood work is at goal.  We we will see her back in 1 year for her next physical.

## 2024-07-26 ENCOUNTER — Other Ambulatory Visit (INDEPENDENT_AMBULATORY_CARE_PROVIDER_SITE_OTHER)

## 2024-07-26 DIAGNOSIS — D72819 Decreased white blood cell count, unspecified: Secondary | ICD-10-CM | POA: Diagnosis not present

## 2024-07-26 LAB — CBC WITH DIFFERENTIAL/PLATELET
Basophils Absolute: 0 K/uL (ref 0.0–0.1)
Basophils Relative: 0.7 % (ref 0.0–3.0)
Eosinophils Absolute: 0.1 K/uL (ref 0.0–0.7)
Eosinophils Relative: 2.5 % (ref 0.0–5.0)
HCT: 39.8 % (ref 36.0–46.0)
Hemoglobin: 13.1 g/dL (ref 12.0–15.0)
Lymphocytes Relative: 48.7 % — ABNORMAL HIGH (ref 12.0–46.0)
Lymphs Abs: 1.8 K/uL (ref 0.7–4.0)
MCHC: 33 g/dL (ref 30.0–36.0)
MCV: 82.3 fl (ref 78.0–100.0)
Monocytes Absolute: 0.3 K/uL (ref 0.1–1.0)
Monocytes Relative: 8.1 % (ref 3.0–12.0)
Neutro Abs: 1.5 K/uL (ref 1.4–7.7)
Neutrophils Relative %: 40 % — ABNORMAL LOW (ref 43.0–77.0)
Platelets: 248 K/uL (ref 150.0–400.0)
RBC: 4.84 Mil/uL (ref 3.87–5.11)
RDW: 14.3 % (ref 11.5–15.5)
WBC: 3.8 K/uL — ABNORMAL LOW (ref 4.0–10.5)

## 2024-07-27 ENCOUNTER — Ambulatory Visit: Payer: Self-pay | Admitting: Family Medicine

## 2024-07-27 NOTE — Progress Notes (Signed)
 Her white blood cell count still little bit low.  This is probably not anything significant however it would probably be a good idea for her to see a hematologist to further evaluate.  Please place referral if she is agreeable.

## 2024-07-29 ENCOUNTER — Other Ambulatory Visit: Payer: Self-pay | Admitting: Family Medicine

## 2024-07-29 ENCOUNTER — Other Ambulatory Visit: Payer: Self-pay

## 2024-07-29 ENCOUNTER — Other Ambulatory Visit (HOSPITAL_BASED_OUTPATIENT_CLINIC_OR_DEPARTMENT_OTHER): Payer: Self-pay

## 2024-07-29 MED ORDER — DICLOFENAC SODIUM 75 MG PO TBEC
75.0000 mg | DELAYED_RELEASE_TABLET | Freq: Two times a day (BID) | ORAL | 0 refills | Status: DC
  Filled 2024-07-29: qty 30, 15d supply, fill #0

## 2024-07-30 ENCOUNTER — Other Ambulatory Visit (HOSPITAL_BASED_OUTPATIENT_CLINIC_OR_DEPARTMENT_OTHER): Payer: Self-pay

## 2024-09-02 ENCOUNTER — Other Ambulatory Visit (HOSPITAL_BASED_OUTPATIENT_CLINIC_OR_DEPARTMENT_OTHER): Payer: Self-pay

## 2024-09-02 ENCOUNTER — Other Ambulatory Visit: Payer: Self-pay

## 2024-09-02 ENCOUNTER — Other Ambulatory Visit: Payer: Self-pay | Admitting: Family Medicine

## 2024-09-02 MED ORDER — MOUNJARO 15 MG/0.5ML ~~LOC~~ SOAJ
15.0000 mg | SUBCUTANEOUS | 1 refills | Status: AC
  Filled 2024-09-02: qty 6, 84d supply, fill #0

## 2024-09-02 MED ORDER — DICLOFENAC SODIUM 75 MG PO TBEC
75.0000 mg | DELAYED_RELEASE_TABLET | Freq: Two times a day (BID) | ORAL | 0 refills | Status: AC
  Filled 2024-09-02: qty 30, 15d supply, fill #0

## 2024-09-08 ENCOUNTER — Other Ambulatory Visit (HOSPITAL_BASED_OUTPATIENT_CLINIC_OR_DEPARTMENT_OTHER): Payer: Self-pay

## 2025-06-23 ENCOUNTER — Encounter: Admitting: Family Medicine
